# Patient Record
Sex: Male | Born: 1970 | Race: White | Hispanic: No | Marital: Married | Smoking: Never smoker
Health system: Southern US, Community
[De-identification: ages and names within clinical notes are randomized; demographics above are authoritative.]

## PROBLEM LIST (undated history)

## (undated) DIAGNOSIS — K219 Gastro-esophageal reflux disease without esophagitis: Secondary | ICD-10-CM

## (undated) HISTORY — PX: ANKLE SURGERY: SHX546

---

## 2016-02-08 ENCOUNTER — Other Ambulatory Visit: Payer: Self-pay | Admitting: Family Medicine

## 2016-02-08 ENCOUNTER — Encounter: Payer: Self-pay | Admitting: Family Medicine

## 2016-02-08 ENCOUNTER — Ambulatory Visit (INDEPENDENT_AMBULATORY_CARE_PROVIDER_SITE_OTHER): Payer: Managed Care, Other (non HMO) | Admitting: Family Medicine

## 2016-02-08 VITALS — BP 123/69 | HR 52 | Temp 98.4°F | Resp 16 | Ht 74.0 in | Wt 214.0 lb

## 2016-02-08 DIAGNOSIS — Z7689 Persons encountering health services in other specified circumstances: Secondary | ICD-10-CM | POA: Diagnosis not present

## 2016-02-08 DIAGNOSIS — R399 Unspecified symptoms and signs involving the genitourinary system: Secondary | ICD-10-CM | POA: Diagnosis not present

## 2016-02-08 DIAGNOSIS — Z13220 Encounter for screening for lipoid disorders: Secondary | ICD-10-CM

## 2016-02-08 DIAGNOSIS — Z114 Encounter for screening for human immunodeficiency virus [HIV]: Secondary | ICD-10-CM

## 2016-02-08 DIAGNOSIS — Z131 Encounter for screening for diabetes mellitus: Secondary | ICD-10-CM

## 2016-02-08 DIAGNOSIS — Z23 Encounter for immunization: Secondary | ICD-10-CM | POA: Diagnosis not present

## 2016-02-08 DIAGNOSIS — N401 Enlarged prostate with lower urinary tract symptoms: Secondary | ICD-10-CM

## 2016-02-08 DIAGNOSIS — Z0000 Encounter for general adult medical examination without abnormal findings: Secondary | ICD-10-CM

## 2016-02-08 DIAGNOSIS — N138 Other obstructive and reflux uropathy: Secondary | ICD-10-CM | POA: Insufficient documentation

## 2016-02-08 NOTE — Assessment & Plan Note (Signed)
Consistent clinically with new suspected diagnosis chronic gradual worsening BPH, with lower urinary tract symptoms (LUTS) without evidence of obstruction. - AUA BPH score 17 (moderate) (pre-treatment) - Never on therapy - No prior PSA - Last DRE reported 2008, normal - No known personal/family history of prostate CA  Plan: 1. Counseling today on likely diagnosis BPH, prognosis, treatment, answered questions. Will defer DRE until next week with Annual Physical during complete exam for estimation of prostate size. Discussed consider PSA test with upcoming annual physical labs, mutual decision to hold on PSA for now give age < 41 and clinically consistent history of BPH, pending DRE and progress of treatment with likely flomax soon, will consider PSA sooner otherwise can start screening at age 48

## 2016-02-08 NOTE — Progress Notes (Signed)
Subjective:    Patient ID: Mike Dean, male    DOB: Sep 22, 1970, 46 y.o.   MRN: [id number]  Mike Dean is a 46 y.o. male presenting on 02/08/2016 for [place] care with new provider. Recently moved to [place] about >1 year ago from [place], interested to get annual physical for insurance purposes at work.   HPI   ESTABLISH CARE - Reports no significant known chronic medical conditions previously treated for. No problems with HTN, HLD, Glucose. He is interested in general health screening for work insurance, would like labs and routine screening tests. Does not recall last time labs checked several years ago. - Previously athlete with runner - Current lifestyle regimen with tries to limit sugar and carb, no fried foods, 4-5 days week high intensity work-out 30-45 min weight and cardio, structured with trainer at gym  History of possible BPH / LUTS - Prior testing with DRE about 2008 was normal prostate, no prior PSA. But has had more increased LUTS since that time. - Describes weak stream, frequent urination, works out in evening and will drink larger amounts of water in afternoon and in evening, concerned that increased fluid intake may be contributing. Regardless anytime of day soon after drinking any fluid he often has to void. Also he endorses small bit of leakage or dribble following void - See AUA BPH scoring below  Health Maintenance: - Has not taken Flu shot in years, would like to get this today, counseled on side effects / benefits - Suspects last Tetanus vaccine about 2006, would like get this today - No family history of prostate cancer - Due for routine HIV screen  ----------------------------------------------------------------------  AUA BPH Symptom Score over past 1 month 1. Sensation of not emptying bladder post void - 1 2. Urinate less than 2 hour after finish last void - 5 3. Start/Stop several times during void - 0 4. Difficult to postpone  urination - 3 5. Weak urinary stream - 4 6. Push or strain urination - 2 7. Nocturia - 2 times  Total Score: 17 (Moderate BPH symptoms)  History reviewed. No pertinent past medical history. Social History   Social History  . Marital status: Married    Spouse name: N/A  . Number of children: N/A  . Years of education: N/A   Occupational History  . [place] [employer], broadcasting/film/video)    Social History Main Topics  . Smoking status: Never Smoker  . Smokeless tobacco: Never Used  . Alcohol use Yes  . Drug use: Unknown  . Sexual activity: Not on file   Other Topics Concern  . Not on file   Social History Narrative  . No narrative on file   Family History  Problem Relation Age of Onset  . Stroke Mother   . Bipolar disorder Sister   . Breast cancer Maternal Grandmother   . Throat cancer Maternal Grandfather     Smoker  . Colon cancer Other 81  . Prostate cancer Neg Hx    No current outpatient prescriptions on file prior to visit.   No current facility-administered medications on file prior to visit.     Review of Systems Per HPI unless specifically indicated above     Objective:    BP 123/69 (BP Location: Right Arm, Patient Position: Sitting, Cuff Size: Normal)   Pulse (!) 52   Temp 98.4 F (36.9 C) (Oral)   Resp 16   Ht 6\' 2"  (1.88 m)   Wt 214 lb (97.1  kg)   BMI 27.48 kg/m   Wt Readings from Last 3 Encounters:  02/08/16 214 lb (97.1 kg)    Physical Exam  Constitutional: He appears well-developed and well-nourished. No distress.  Well-appearing, comfortable, cooperative  HENT:  Head: Normocephalic and atraumatic.  Mouth/Throat: Oropharynx is clear and moist.  Eyes: Conjunctivae are normal.  Cardiovascular: Regular rhythm, normal heart sounds and intact distal pulses.   No murmur heard. Low resting heart rate at baseline  Pulmonary/Chest: Effort normal and breath sounds normal. No respiratory distress. He has no wheezes. He has no rales.  Neurological:  He is alert.  Skin: Skin is warm and dry. He is not diaphoretic.  Psychiatric: His behavior is normal.  Nursing note and vitals reviewed.  No results found for this or any previous visit.    Assessment & Plan:   Problem List Items Addressed This Visit    Lower urinary tract symptoms (LUTS)    Consistent clinically with new suspected diagnosis chronic gradual worsening BPH, with lower urinary tract symptoms (LUTS) without evidence of obstruction. - AUA BPH score 17 (moderate) (pre-treatment) - Never on therapy - No prior PSA - Last DRE reported 2008, normal - No known personal/family history of prostate CA  Plan: 1. Counseling today on likely diagnosis BPH, prognosis, treatment, answered questions. Will defer DRE until next week with Annual Physical during complete exam for estimation of prostate size. Discussed consider PSA test with upcoming annual physical labs, mutual decision to hold on PSA for now give age < 11 and clinically consistent history of BPH, pending DRE and progress of treatment with likely flomax soon, will consider PSA sooner otherwise can start screening at age 37       Other Visit Diagnoses    Encounter to establish care with new doctor    -  Primary   Establish care today, will place future orders for fasting labs CMET, Lipids, A1c routine HIV, follow-up results at annual physical within 1 week, flu/tdap today   Needs flu shot       Relevant Orders   Flu Vaccine QUAD 36+ mos IM (Completed)   Need for Tdap vaccination       Relevant Orders   Tdap vaccine greater than or equal to 7yo IM (Completed)      No orders of the defined types were placed in this encounter.     Follow up plan: Return in about 2 days (around 02/10/2016) for Lab only visit.  [name], [facility] 02/08/2016, 10:04 PM

## 2016-02-08 NOTE — Patient Instructions (Addendum)
Thank you for coming in to clinic today.  1. Keep up the good work with healthy lifestyle  2. I think that you most likely have lower urinary tract symptoms from BPH or benign prostate hypertrophy - Try for about a week to adjust water intake, perhaps pick a time in evening to stop water intake and try to moderate this to see if this helps any of your symptoms - Next visit we can check rectal exam to see size of prostate - Consider starting Flomax (Tamsulosin) 0.4mg  daily at next visit, to help voiding symptoms  TDap and Flu vaccines today, may take ibuprofen or tylenol as needed for these  You will be due for FASTING BLOOD WORK (no food or drink after midnight before, only water or coffee without cream/sugar on the morning of)  - Please go ahead and schedule a "Lab Only" visit in the morning at the clinic for lab draw in 1-2 days  For Lab Results, once available within 2-3 days of blood draw, you can can log in to MyChart online to view your results and a brief explanation. Also, we can discuss results at next follow-up visit.  If you have any other questions or concerns, please feel free to call the clinic or send a message through [place]. You may also schedule an earlier appointment if necessary.  [name], DO [place]

## 2016-02-10 ENCOUNTER — Other Ambulatory Visit: Payer: Self-pay | Admitting: Family Medicine

## 2016-02-10 ENCOUNTER — Other Ambulatory Visit: Payer: Self-pay | Admitting: *Deleted

## 2016-02-10 ENCOUNTER — Other Ambulatory Visit: Payer: Managed Care, Other (non HMO)

## 2016-02-10 DIAGNOSIS — Z114 Encounter for screening for human immunodeficiency virus [HIV]: Secondary | ICD-10-CM

## 2016-02-10 DIAGNOSIS — Z131 Encounter for screening for diabetes mellitus: Secondary | ICD-10-CM

## 2016-02-10 DIAGNOSIS — Z13220 Encounter for screening for lipoid disorders: Secondary | ICD-10-CM

## 2016-02-10 DIAGNOSIS — Z0000 Encounter for general adult medical examination without abnormal findings: Secondary | ICD-10-CM

## 2016-02-11 LAB — COMPLETE METABOLIC PANEL WITH GFR
ALT: 18 U/L (ref 9–46)
AST: 17 U/L (ref 10–40)
Albumin: 4.3 g/dL (ref 3.6–5.1)
Alkaline Phosphatase: 63 U/L (ref 40–115)
BUN: 24 mg/dL (ref 7–25)
CALCIUM: 9.4 mg/dL (ref 8.6–10.3)
CHLORIDE: 102 mmol/L (ref 98–110)
CO2: 25 mmol/L (ref 20–31)
Creat: 1.16 mg/dL (ref 0.60–1.35)
GFR, Est African American: 86 mL/min (ref >=60)
GFR, Est Non African American: 75 mL/min (ref >=60)
Glucose, Bld: 87 mg/dL (ref 65–99)
POTASSIUM: 4.3 mmol/L (ref 3.5–5.3)
Sodium: 139 mmol/L (ref 135–146)
Total Bilirubin: 0.8 mg/dL (ref 0.2–1.2)
Total Protein: 7 g/dL (ref 6.1–8.1)

## 2016-02-11 LAB — LIPID PANEL
CHOL/HDL RATIO: 2.6 ratio (ref <5.0)
CHOLESTEROL: 147 mg/dL (ref <200)
HDL: 57 mg/dL (ref >40)
LDL Cholesterol: 78 mg/dL (ref <100)
TRIGLYCERIDES: 58 mg/dL (ref <150)
VLDL: 12 mg/dL (ref <30)

## 2016-02-11 LAB — HEMOGLOBIN A1C
Hgb A1c MFr Bld: 5.1 % (ref <5.7)
MEAN PLASMA GLUCOSE: 100 mg/dL

## 2016-02-11 LAB — HIV ANTIBODY (ROUTINE TESTING W REFLEX): HIV 1&2 Ab, 4th Generation: NONREACTIVE

## 2016-02-13 ENCOUNTER — Ambulatory Visit (INDEPENDENT_AMBULATORY_CARE_PROVIDER_SITE_OTHER): Payer: Managed Care, Other (non HMO) | Admitting: Family Medicine

## 2016-02-13 ENCOUNTER — Encounter: Payer: Self-pay | Admitting: Family Medicine

## 2016-02-13 VITALS — BP 118/67 | HR 56 | Temp 98.2°F | Resp 16 | Ht 74.0 in | Wt 214.0 lb

## 2016-02-13 DIAGNOSIS — J3089 Other allergic rhinitis: Secondary | ICD-10-CM | POA: Diagnosis not present

## 2016-02-13 DIAGNOSIS — N138 Other obstructive and reflux uropathy: Secondary | ICD-10-CM

## 2016-02-13 DIAGNOSIS — N401 Enlarged prostate with lower urinary tract symptoms: Secondary | ICD-10-CM

## 2016-02-13 DIAGNOSIS — Z113 Encounter for screening for infections with a predominantly sexual mode of transmission: Secondary | ICD-10-CM

## 2016-02-13 DIAGNOSIS — Z0000 Encounter for general adult medical examination without abnormal findings: Secondary | ICD-10-CM

## 2016-02-13 MED ORDER — FEXOFENADINE HCL 180 MG PO TABS: 180.0000 mg | ORAL_TABLET | Freq: Every day | ORAL | 5 refills | Status: DC

## 2016-02-13 MED ORDER — TAMSULOSIN HCL 0.4 MG PO CAPS: 0.4000 mg | ORAL_CAPSULE | Freq: Every day | ORAL | 5 refills | Status: DC

## 2016-02-13 MED ORDER — FLUTICASONE PROPIONATE 50 MCG/ACT NA SUSP: 2.0000 | Freq: Every day | NASAL | 3 refills | Status: DC

## 2016-02-13 NOTE — Assessment & Plan Note (Signed)
Consistent clinically with new diagnosis chronic gradual worsening BPH, with lower urinary tract symptoms (LUTS) without evidence of obstruction. - AUA BPH score 17 (moderate) (pre-treatment) - Never on therapy - No prior PSA - DRE done today, with moderately enlarged smooth symmetrical prostate, compared to reported DRE 2008, normal - No known personal/family history of prostate CA  Plan: 1. Reassurance that given DRE and history most likely BPH, also reassurance with normal A1c screening unlikely other etiology of polyuria / frequency, however will also check UA, and finish STD screening 2. Start Flomax 0.4mg  daily today, counseled on side effects and effectiveness 3. Advised to adjust water intake, reduce down to about 40-50 oz daily, can reduce some caffeine as well to avoid worsening urinary frequency 4. Check future UA dipstick at nurse visit, will return for first void with GC/Chlam testing later this week 5. Follow-up 6 weeks to 3 months on Flomax, if doing very well then will likely continue at this dose for a while, also counseling on may need increase dose 0.8mg  daily, lastly if need additional therapy then we will check PSA for baseline, prior to starting med such as Finasteride to shrink prostate, and if at any point need second opinion or other concern can get Urology consult

## 2016-02-13 NOTE — Progress Notes (Signed)
Subjective:    Patient ID: Mike Dean, male    DOB: 10-Jan-1971, 46 y.o.   MRN: [id number]  Mike Dean is a 46 y.o. male presenting on 02/13/2016 for Annual Exam  HPI   Annual Physical - Recently seen in office to establish care with new provider, see note for details on background medical history. - Had recent labwork that was essentially normal. Screening labs as well. See lab results for full details, no additional questions on labs. Did have mildly elevated creatinine (but no baseline and not flagged abnormal), patient does admit to consuming some protein supplement regularly while going to the gym. - He remains active, previously athlete with runner - Current lifestyle regimen with tries to limit sugar and carb, no fried foods, 4-5 days week high intensity work-out 30-45 min weight and cardio, structured with trainer at gym  Seasonal and Environemental Allergies - Reports more recently has had problem with environmental / seasonal allergies, did not have as many problems in [place], now in [place] has had difficulty in Spring and Fall seasons. Previously he had to take Allegra-D OTC for 4-6 weeks due to significant sinus symptoms with pressure, congestion, generalized fatigue with Hay Fever, did not take anything preventatively, also has tried OTC Flonase in the past. - Prior history of recurrent sinus infections - Interested in preventative allergy medication with Fexofenadine  STD SCREENING - Request STD testing today, last visit 02/08/16 had routine HIV screening result that was Negative. Today patient reports request for complete STD screening, has not had prior screen test done for many years, possibly 20 years ago. He is concerned with possible exposure to genital HSV about 7 months ago, but denies any direct contact with HSV lesions or outbreak - He has been asymptomatic - Denies any known STD history - Denies any genital ulcer, penile discharge, genital rash or vesicles  BPH  with LUTS: - Last visit 02/08/16 discussed likely new dx BPH given his significant LUTS, see AUA BPH score below from last visit. He has never been treated before. Has had prior DRE in 2008 mostly unremarkable. He has tried to adjust his fluid intake since last visit and noticed it does help a little, but still urinating frequently and still has weak stream often - Admits fluid intake currently is about 7-8 bottles (12oz ea) water most days usually when goes to gym especially, also will drink about 3-4 cups coffee most days - Still describes weak stream, frequent urination, nocturia up to 2 times nightly - Interested in starting Flomax medicine as discussed last visit  Health Maintenance: - UTD Flu vaccine, TDap, on 02/08/16 - No family history of prostate cancer  ----------------------------------------------------------------------  02/08/16 AUA BPH Symptom Score over past 1 month (Questionnaire asked on last visit 02/08/16 - Did not repeat score today) 1. Sensation of not emptying bladder post void - 1 2. Urinate less than 2 hour after finish last void - 5 3. Start/Stop several times during void - 0 4. Difficult to postpone urination - 3 5. Weak urinary stream - 4 6. Push or strain urination - 2 7. Nocturia - 2 times  Total Score: 17 (Moderate BPH symptoms)   No past medical history on file. Social History   Social History  . Marital status: Married    Spouse name: N/A  . Number of children: N/A  . Years of education: N/A   Occupational History  . 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 Smoking  status: Never Smoker  . Smokeless tobacco: Never Used  . Alcohol use Yes  . Drug use: Unknown  . Sexual activity: Not on file   Other Topics Concern  . Not on file   Social History Narrative  . No narrative on file   Family History  Problem Relation Age of Onset  . Stroke Mother   . Bipolar disorder Sister   . Breast cancer Maternal Grandmother   .  Throat cancer Maternal Grandfather     Smoker  . Colon cancer Other 26  . Prostate cancer Neg Hx    No current outpatient prescriptions on file prior to visit.   No current facility-administered medications on file prior to visit.     Review of Systems  Constitutional: Negative for activity change, appetite change, chills, diaphoresis, fatigue, fever and unexpected weight change.  HENT: Negative for congestion, hearing loss and sinus pressure.   Eyes: Negative for visual disturbance.  Respiratory: Negative for cough, chest tightness, shortness of breath and wheezing.   Cardiovascular: Negative for chest pain, palpitations and leg swelling.  Gastrointestinal: Negative for abdominal pain, constipation, diarrhea, nausea and vomiting.  Endocrine: Positive for polyuria. Negative for cold intolerance and polydipsia.  Genitourinary: Positive for difficulty urinating (Weak stream, see HPI) and urgency. Negative for decreased urine volume, discharge, dysuria, flank pain, frequency, genital sores, hematuria, penile pain, penile swelling, scrotal swelling and testicular pain.  Musculoskeletal: Positive for back pain (Mild bilateral low back muscle soreness from work-out). Negative for arthralgias, joint swelling, myalgias and neck pain.  Skin: Negative for rash.  Allergic/Immunologic: Positive for environmental allergies (Spring / Fall).  Neurological: Negative for dizziness, weakness, light-headedness, numbness and headaches.  Hematological: Negative for adenopathy.  Psychiatric/Behavioral: Negative for behavioral problems, dysphoric mood and sleep disturbance. The patient is not nervous/anxious.    Per HPI unless specifically indicated above     Objective:    BP 118/67   Pulse (!) 56   Temp 98.2 F (36.8 C) (Oral)   Resp 16   Ht 6\' 2"  (1.88 m)   Wt 214 lb (97.1 kg)   BMI 27.48 kg/m   Wt Readings from Last 3 Encounters:  02/13/16 214 lb (97.1 kg)  02/08/16 214 lb (97.1 kg)      Physical Exam  Constitutional: He is oriented to person, place, and time. He appears well-developed and well-nourished. No distress.  Well-appearing, comfortable, cooperative  HENT:  Head: Normocephalic and atraumatic.  Mouth/Throat: Oropharynx is clear and moist.  Frontal / maxillary sinuses non-tender. Nares patent without purulence or edema. Bilateral TMs clear without erythema, effusion or bulging. Oropharynx clear without erythema, exudates, edema or asymmetry.  Eyes: Conjunctivae and EOM are normal. Pupils are equal, round, and reactive to light. Right eye exhibits no discharge. Left eye exhibits no discharge.  Neck: Normal range of motion. Neck supple. No thyromegaly present.  Cardiovascular: Regular rhythm, normal heart sounds and intact distal pulses.   No murmur heard. Low resting heart rate at baseline  Pulmonary/Chest: Effort normal and breath sounds normal. No respiratory distress. He has no wheezes. He has no rales.  Abdominal: Soft. He exhibits no distension. There is no tenderness.  Genitourinary:  Genitourinary Comments: Rectal/DRE: Normal external exam without hemorrhoids fissures or abnormality. DRE with palpation of moderately enlarged prostate smooth symmetrical without nodule or tenderness.  Declined external genitalia exam today without any concerns.  Musculoskeletal: Normal range of motion. He exhibits no edema.  Back normal without deformity or abnormal curvature. - Bilateral lower lumbar  paraspinal muscle mild hypertonicity with minimal soreness to palpation. Non tender over spinous process. Full active ROM.  Upper / Lower Extremities: - Normal muscle tone, strength bilateral upper extremities 5/5, lower extremities 5/5 - Bilateral shoulders, knees, wrist, ankles without deformity, tenderness, effusion - Normal Gait  Lymphadenopathy:    He has no cervical adenopathy.  Neurological: He is alert and oriented to person, place, and time. Coordination normal.   Skin: Skin is warm and dry. No rash noted. He is not diaphoretic. No erythema.  Psychiatric: He has a normal mood and affect. His behavior is normal.  Nursing note and vitals reviewed.  I have personally reviewed the following lab results from 02/10/16.  Results for orders placed or performed in visit on 02/10/16  RPR  Result Value Ref Range   RPR Ser Ql  NON REAC  HSV 2 antibody, IgG  Result Value Ref Range   HSV 2 Glycoprotein G Ab, IgG  <0.90 Index      Assessment & Plan:   Problem List Items Addressed This Visit    Environmental and seasonal allergies    Consistent with seasonal allergies Spring/Fall likely rhinosinusitis, worse living in [place] - Sent rx Fexofenadine 180mg  daily for preventative use, start mid Feb through season - Only use Fexofenadine-D if acute problem with sinuses, cautioned against chronic use - Sent rx Flonase use 4-6 weeks seasonally as needed      Relevant Medications   fluticasone (FLONASE) 50 MCG/ACT nasal spray   fexofenadine (ALLEGRA) 180 MG tablet   BPH with obstruction/lower urinary tract symptoms    Consistent clinically with new diagnosis chronic gradual worsening BPH, with lower urinary tract symptoms (LUTS) without evidence of obstruction. - AUA BPH score 17 (moderate) (pre-treatment) - Never on therapy - No prior PSA - DRE done today, with moderately enlarged smooth symmetrical prostate, compared to reported DRE 2008, normal - No known personal/family history of prostate CA  Plan: 1. Reassurance that given DRE and history most likely BPH, also reassurance with normal A1c screening unlikely other etiology of polyuria / frequency, however will also check UA, and finish STD screening 2. Start Flomax 0.4mg  daily today, counseled on side effects and effectiveness 3. Advised to adjust water intake, reduce down to about 40-50 oz daily, can reduce some caffeine as well to avoid worsening urinary frequency 4. Check future UA dipstick at nurse visit,  will return for first void with GC/Chlam testing later this week 5. Follow-up 6 weeks to 3 months on Flomax, if doing very well then will likely continue at this dose for a while, also counseling on may need increase dose 0.8mg  daily, lastly if need additional therapy then we will check PSA for baseline, prior to starting med such as Finasteride to shrink prostate, and if at any point need second opinion or other concern can get Urology consult      Relevant Medications   tamsulosin (FLOMAX) 0.4 MG CAPS capsule   Other Relevant Orders   POCT urinalysis dipstick    Other Visit Diagnoses    Annual physical exam    -  Primary   UTD on health maintenance, labs yearly with annual physical, trend Cr to determine if mildly elevated at baseline, also very low ASCVD risk 1.2%   Screening for STDs (sexually transmitted diseases)       By request, check RPR, HSV2 serum today, and future urine probe GC/Chlam return for first urine spec this week, 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    Relevant Orders   RPR   HSV 2 antibody, IgG   GC/Chlamydia Probe Amp     Meds ordered this encounter  Medications  . fluticasone (FLONASE) 50 MCG/ACT nasal spray    Sig: Place 2 sprays into both nostrils daily. Use for 4-6 weeks then stop and use seasonally or as needed.    Dispense:  16 g    Refill:  3  . fexofenadine (ALLEGRA) 180 MG tablet    Sig: Take 1 tablet (180 mg total) by mouth daily.    Dispense:  30 tablet    Refill:  5  . tamsulosin (FLOMAX) 0.4 MG CAPS capsule    Sig: Take 1 capsule (0.4 mg total) by mouth daily.    Dispense:  30 capsule    Refill:  5      Follow up plan: Return in about 6 weeks (around 03/26/2016) for BPH.  [name], [facility] 02/13/2016, 2:24 PM

## 2016-02-13 NOTE — Patient Instructions (Signed)
Thank you for coming in to clinic today.  1. Most likely have BPH, with lower urinary tract symptoms - Your prostate does feel enlarged on exam today, this is mild to moderate, and no concerning features of a prostate cancer - Start Flomax 0.4mg  daily in morning, everyday to help relax smooth muscles and allow you to void better, this should help the weak stream and may eventually reduce nocturia overnight - Try to slightly reduce amount of fluid intake, aim for about 45 to 50 oz water in 24 hours, this may reduce urinary frequency as well  2. Today other blood tests follow-up STD screening, will discuss results  3. For seasonal allergies - Fexofenadine 180mg  daily for anti-histamine start mid February continue through spring - Also can start Flonase 2 sprays in each nostril daily for about 4-6 weeks or so in early March to help prevent problem  As well  Please schedule NURSE only visit for Urine Test, 1st void of day  Please schedule a follow-up appointment with Dr. Parks Ranger in 6 weeks to 3 month follow-up for BPH  If you have any other questions or concerns, please feel free to call the clinic or send a message through [place]. You may also schedule an earlier appointment if necessary.  [name], DO [place]

## 2016-02-13 NOTE — Assessment & Plan Note (Signed)
Consistent with seasonal allergies Spring/Fall likely rhinosinusitis, worse living in [place] - Sent rx Fexofenadine 180mg  daily for preventative use, start mid Feb through season - Only use Fexofenadine-D if acute problem with sinuses, cautioned against chronic use - Sent rx Flonase use 4-6 weeks seasonally as needed

## 2016-02-14 ENCOUNTER — Encounter: Payer: Self-pay | Admitting: Family Medicine

## 2016-02-14 LAB — HSV 2 ANTIBODY, IGG: HSV 2 Glycoprotein G Ab, IgG: <0.9 Index (ref <0.90)

## 2016-02-14 LAB — RPR

## 2016-02-23 ENCOUNTER — Other Ambulatory Visit: Payer: Self-pay

## 2016-02-23 DIAGNOSIS — N138 Other obstructive and reflux uropathy: Secondary | ICD-10-CM

## 2016-02-23 DIAGNOSIS — N401 Enlarged prostate with lower urinary tract symptoms: Principal | ICD-10-CM

## 2016-02-23 LAB — POCT URINALYSIS DIPSTICK
Bilirubin, UA: NEGATIVE
Blood, UA: NEGATIVE
Glucose, UA: NEGATIVE
Ketones, UA: NEGATIVE
Leukocytes, UA: NEGATIVE
Nitrite, UA: NEGATIVE
Protein, UA: NEGATIVE
Spec Grav, UA: 1.015
Urobilinogen, UA: NEGATIVE
pH, UA: 5

## 2016-02-24 LAB — GC/CHLAMYDIA PROBE AMP
CT PROBE, AMP APTIMA: NOT DETECTED
GC Probe RNA: NOT DETECTED

## 2016-03-26 ENCOUNTER — Ambulatory Visit: Payer: Managed Care, Other (non HMO) | Admitting: Family Medicine

## 2016-04-09 ENCOUNTER — Encounter: Payer: Self-pay | Admitting: Family Medicine

## 2016-04-09 ENCOUNTER — Ambulatory Visit (INDEPENDENT_AMBULATORY_CARE_PROVIDER_SITE_OTHER): Payer: Managed Care, Other (non HMO) | Admitting: Family Medicine

## 2016-04-09 VITALS — BP 118/72 | HR 71 | Temp 98.2°F | Resp 16 | Ht 74.0 in | Wt 216.7 lb

## 2016-04-09 DIAGNOSIS — J3089 Other allergic rhinitis: Secondary | ICD-10-CM

## 2016-04-09 DIAGNOSIS — N138 Other obstructive and reflux uropathy: Secondary | ICD-10-CM

## 2016-04-09 DIAGNOSIS — N401 Enlarged prostate with lower urinary tract symptoms: Secondary | ICD-10-CM | POA: Diagnosis not present

## 2016-04-09 DIAGNOSIS — J302 Other seasonal allergic rhinitis: Secondary | ICD-10-CM

## 2016-04-09 MED ORDER — IPRATROPIUM BROMIDE 0.06 % NA SOLN: 2.0000 | Freq: Four times a day (QID) | NASAL | 0 refills | Status: DC

## 2016-04-09 NOTE — Assessment & Plan Note (Addendum)
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

## 2016-04-09 NOTE — Assessment & Plan Note (Signed)
Significantly improved LUTS on Flomax, consistent with BPH with LUTS without obstruction. Additionally prior work-up unremarkable for other etiology of urinary symptoms. - On Flomax AUA BPH score improved to 6 (from 17) - No prior PSA - Last DRE 02/13/16, moderately enlarged smooth symmetrical prostate - No known personal/family history of prostate CA  Plan: 1. Reassurance given clinical improvement - continue current Flomax 0.4mg  daily - discussion on duration of therapy, ideally would continue longer term, however eager to do trial off after 6 months, agree this is reasonable, if needs to resume, no problem 2. Future again advised to adjust water intake, reduce down to about 40-50 oz daily, can reduce some caffeine as well to avoid worsening urinary frequency 3. Future plan for PSA age >50+ now good improvement on Flomax alone 4. Follow-up 6 months for BPH - if needed could consider dose titrate up Flomax vs possible Finasteride, however suspect will do well on low dose Flomax vs trial off

## 2016-04-09 NOTE — Patient Instructions (Signed)
Thank you for coming in to clinic today.  1. Now likely confirmed BPH given improvement on Flomax - Continue Flomax 0.4mg  daily for now - in future about 3 months if you want to try off of this that is fine to see how your symptoms respond - Your refill goes through 07/2016, let me know if you need a refill, we can do 90 day supply if needed, most people do end up needing to take this long term if it is helping - Not due for PSA until age >28 - Also if you want to try to slightly reduce amount of fluid intake, aim for about 45 to 50 oz water in 24 hours  2. For seasonal allergies - Fexofenadine 180mg  daily for anti-histamine start mid February continue through spring - Start Atrovent nasal spray decongestant 2 sprays in each nostril up to 4 times daily for 7 days - HOLD Flonase while on Atrovent, then resume for 4-6 weeks or so in early March to help prevent problem  As well - You can try OTC Decongestant or sinus medicine as well, taken Ibuprofen/Tylenol as needed for achy symptoms - If worsening to fevers, worsening sinus pain, thicker purulent drainage, productive cough or chest congestion, notify office later this week can add antibiotic, or recommend Mucinex-DM  Please schedule a follow-up appointment with Dr. Parks Ranger in 6 months for follow-up BPH  If you have any other questions or concerns, please feel free to call the clinic or send a message through [place]. You may also schedule an earlier appointment if necessary.  [name], DO [place]

## 2016-04-09 NOTE — Progress Notes (Signed)
Subjective:    Patient ID: Mike Dean, male    DOB: 1970-08-01, 46 y.o.   MRN: [id number]  Mike Dean is a 46 y.o. male presenting on 04/09/2016 for Follow-up (obtw pt has sinusitis no fever and some HA onset 3 days)  HPI   Acute Allergic Rhinosinusitis / Seasonal and Environemental Allergies Last seen 01/2016 for physical, and discussion on allergies, he was given rx Fexofenadine 180mg  daily (no longer on decongestant), and given Flonase nasal spray. - Today follows up for BPH, see below, and has additional complaint of URI sinusitis symptoms. Started 2-3 days ago with worsening sinus congestion, raw irritated throat with sinus drainage, feels generalized achy and tired. Recent potential sick contact with international air travel to [place] last week, no known possible sick contacts - Does have prior history of some recurrent sinus infections and prior hay fever, worst Spring/Summer - Continues on Allegra, and recently added back Flonase for past few days (he had been off of this previously) - Denies any fevers, chills, sweats, nausea, vomiting, diarrhea, abdominal pain, headache, ear pain  FOLLOW-UP BPHwith LUTS: Last visits 1/24 and 02/13/16 for same problem with new diagnosis BPH, see prior note for background information. He had symptoms initially of significant LUTS, elevated AUA BPH score, see below. He was started on Flomax 0.4mg  daily - Today returns for follow-up about 2 months later. He is doing very well on Flomax 0.4mg  daily, tolerating well without side effects. Reports significant improvement in most LUTS, specifically resolved leakage and dripping following voiding, less nocturia, see AUA BPH score below - No change since last visit to fluid intake, still currently about 7-8 bottles (12oz ea) water most days usually when goes to gym especially, also will drink about 3-4 cups coffee most days - No family history of prostate cancer - Denies any urinary retention, dysuria,  hematuria, incontinence  ---------------------------------------------------------------------  04/09/16 on Flomax AUA BPH Symptom Score over past 1 month 1. Sensation of not emptying bladder post void - 0 2. Urinate less than 2 hour after finish last void - 2 3. Start/Stop several times during void - 0 4. Difficult to postpone urination - 1 5. Weak urinary stream - 2 6. Push or strain urination - 0 7. Nocturia - 1 times  Total Score: 6 Mild (compared to last score 17 moderate BPH symptoms on 02/08/16)    Social History  Substance Use Topics  . Smoking status: Never Smoker  . Smokeless tobacco: Never Used  . Alcohol use Yes    Review of Systems   Per HPI unless specifically indicated above     Objective:    BP 118/72   Pulse 71   Temp 98.2 F (36.8 C) (Oral)   Resp 16   Ht 6\' 2"  (1.88 m)   Wt 216 lb 11.2 oz (98.3 kg)   SpO2 99%   BMI 27.82 kg/m   Wt Readings from Last 3 Encounters:  04/09/16 216 lb 11.2 oz (98.3 kg)  02/13/16 214 lb (97.1 kg)  02/08/16 214 lb (97.1 kg)    Physical Exam  Constitutional: He appears well-developed and well-nourished. No distress.  Well-appearing mildly sick but non toxic, comfortable, cooperative  HENT:  Head: Normocephalic and atraumatic.  Mouth/Throat: Oropharynx is clear and moist.  Mild maxillary sinus tenderness bilaterally, frontal non tender. Nares mostly patent with mild congestion without purulence. Bilateral TMs clear without erythema or bulging, very mild clear effusion L TM. Oropharynx clear without erythema, exudates, edema or asymmetry.  Eyes:  Conjunctivae are normal. Right eye exhibits no discharge. Left eye exhibits no discharge.  Neck: Normal range of motion. Neck supple.  Cardiovascular: Normal rate, regular rhythm, normal heart sounds and intact distal pulses.   No murmur heard. Pulmonary/Chest: Effort normal and breath sounds normal. No respiratory distress. He has no wheezes. He has no rales.  Good air  movement  Lymphadenopathy:    He has no cervical adenopathy.  Neurological: He is alert.  Skin: Skin is warm and dry. He is not diaphoretic.  Nursing note and vitals reviewed.  I have personally reviewed the following lab results from 02/2016.  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 Additionally prior work-up unremarkable for other etiology of urinary symptoms. - On Flomax AUA BPH score improved to 6 (from 17) - No prior PSA - Last DRE 02/13/16, moderately enlarged smooth symmetrical prostate - No known personal/family history of prostate CA  Plan: 1. Reassurance given clinical improvement - continue current Flomax 0.4mg  daily - discussion on duration of therapy, ideally would continue longer term, however eager to do trial off after 6 months, agree this is reasonable, if needs to resume, no problem 2. Future again advised to adjust water intake, reduce down to about 40-50 oz daily, can reduce some caffeine as well to avoid worsening urinary frequency 3. Future plan for PSA age >50+ now good improvement on Flomax alone 4. 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  Plan: 1. Reassurance, likely self-limited - no indication for antibiotics at this time 2. Hold Flonase temporarily (since only just restarted) and Start new rx Atrovent nasal spray decongestant 2 sprays in each nostril up to 4 times daily for 7 days - then resume Flonase for prevention in future, use through Spring/Summer 3. Continue Fexofenadine 180mg  daily 4. May add OTC decongestant temporarily for up to 1 week 5. Return criteria reviewed if significant worsening, can notify office consider antibiotic such as Augmentin if consistent with worsening to bacterial infection     Relevant Medications   ipratropium (ATROVENT) 0.06 % nasal spray      Meds ordered this encounter  Medications  .  ipratropium (ATROVENT) 0.06 % nasal spray    Sig: Place 2 sprays into both nostrils 4 (four) times daily. For up to 5-7 days then stop.    Dispense:  15 mL    Refill:  0      Follow up plan: Return in about 6 months (around 10/10/2016) for BPH.  [name], [facility] 04/09/2016, 6:53 PM

## 2016-04-16 ENCOUNTER — Encounter: Payer: Self-pay | Admitting: Family Medicine

## 2016-04-16 DIAGNOSIS — J0110 Acute frontal sinusitis, unspecified: Secondary | ICD-10-CM

## 2016-04-16 MED ORDER — AMOXICILLIN-POT CLAVULANATE 875-125 MG PO TABS: 1.0000 | ORAL_TABLET | Freq: Two times a day (BID) | ORAL | 0 refills | Status: DC

## 2016-10-08 ENCOUNTER — Other Ambulatory Visit: Payer: Self-pay | Admitting: Family Medicine

## 2016-10-08 ENCOUNTER — Encounter: Payer: Self-pay | Admitting: Family Medicine

## 2016-10-08 ENCOUNTER — Ambulatory Visit (INDEPENDENT_AMBULATORY_CARE_PROVIDER_SITE_OTHER): Payer: Managed Care, Other (non HMO) | Admitting: Family Medicine

## 2016-10-08 VITALS — BP 118/61 | HR 65 | Temp 98.6°F | Resp 16 | Ht 74.0 in | Wt 212.0 lb

## 2016-10-08 DIAGNOSIS — N401 Enlarged prostate with lower urinary tract symptoms: Secondary | ICD-10-CM

## 2016-10-08 DIAGNOSIS — D179 Benign lipomatous neoplasm, unspecified: Secondary | ICD-10-CM | POA: Insufficient documentation

## 2016-10-08 DIAGNOSIS — Z0000 Encounter for general adult medical examination without abnormal findings: Secondary | ICD-10-CM

## 2016-10-08 DIAGNOSIS — N138 Other obstructive and reflux uropathy: Secondary | ICD-10-CM | POA: Diagnosis not present

## 2016-10-08 DIAGNOSIS — R7989 Other specified abnormal findings of blood chemistry: Secondary | ICD-10-CM

## 2016-10-08 DIAGNOSIS — Z23 Encounter for immunization: Secondary | ICD-10-CM

## 2016-10-08 DIAGNOSIS — D1779 Benign lipomatous neoplasm of other sites: Secondary | ICD-10-CM

## 2016-10-08 MED ORDER — SAW PALMETTO (SERENOA REPENS) 160 MG PO CAPS: 160.0000 mg | ORAL_CAPSULE | Freq: Two times a day (BID) | ORAL | Status: AC

## 2016-10-08 NOTE — Assessment & Plan Note (Signed)
Remains significantly improved LUTS, now on Saw Palmetto instead of Flomax,  - On Saw Palmetto AUA BPH score relatively stable 9 from 6 (prior 17) - No prior PSA, age < 65 - Last DRE 02/13/16, moderately enlarged smooth symmetrical prostate - No known personal/family history of prostate CA  Plan: 1. Continue OTC supplement Saw Palmetto 160mg  BID - Remain off Flomax for now, may resume this in future, but should only take 1 med if decides to resume, can DC saw palmetto at that time 2. If worsening, consider to adjust water intake, reduce down to about 40-50 oz daily, can reduce some caffeine as well to avoid worsening urinary frequency 3. Future plan for PSA age >97+ 28. Follow-up 4-5 months for Annual Physical - yearly visits at that point unless worsening

## 2016-10-08 NOTE — Patient Instructions (Addendum)
Thank you for coming to the clinic today.  1.  Continue Saw Palmetto 160mg  twice daily, in future if need may switch back to Flomax to try on own  Flu shot today  DUE for FASTING BLOOD WORK (no food or drink after midnight before the lab appointment, only water or coffee without cream/sugar on the morning of)  SCHEDULE "Lab Only" visit in the morning at the clinic for lab draw in 4-5 months  - Make sure Lab Only appointment is at about 1 week before your next appointment, so that results will be available  For Lab Results, once available within 2-3 days of blood draw, you can can log in to MyChart online to view your results and a brief explanation. Also, we can discuss results at next follow-up visit.   Please schedule a Follow-up Appointment to: Return in about 4 months (around 02/14/2017) for Annual Physical.  If you have any other questions or concerns, please feel free to call the clinic or send a message through [place]. You may also schedule an earlier appointment if necessary.  Additionally, you may be receiving a survey about your experience at our clinic within a few days to 1 week by e-mail or mail. We value your feedback.  [name], DO [place]

## 2016-10-08 NOTE — Progress Notes (Signed)
Subjective:    Patient ID: Mike Dean, male    DOB: [birth year], 46 y.o.   MRN: [id number]  Mike Dean is a 46 y.o. male presenting on 10/08/2016 for Benign Prostatic Hypertrophy (follow up)   HPI   FOLLOW-UP BPH with LUTS: - Last visit with me 04/09/16, for follow-up same problem, treated with continued Flomax 0.4mg , see prior notes for background information. - Interval update with patient self discontinued Flomax several months ago and switched to natural remedy with [facility], and has done very well on this route - Today patient reports that he is pleased with natural result on Saw Palmetto, taking 160mg  daily, and tries to take BID as recommended but often forgets 2nd dose - Feels like urinary symptoms much improved compared to pre-treatment, similar to when taking Flomax, his personal preference to not take rx med - Still significant amount of fluid intake with water up to 8 bottles (12 oz each) most days, often when goes to gym inc water, and drinks 3-4 cups coffee - No family history of prostate cancer - Denies any urinary retention, dysuria, hematuria, incontinence  --------------------------------------------------------------------- 10/08/16 AUA BPH Symptom Score over past 1 month 1. Sensation of not emptying bladder post void - 0 2. Urinate less than 2 hour after finish last void - 4 3. Start/Stop several times during void - 0 4. Difficult to postpone urination - 3 5. Weak urinary stream - 0 6. Push or strain urination - 0 7. Nocturia - 2 times (1-3)  Total Score: 9 Mild - on Saw Palmetto Prior scores: -  6 - on Flomax 0.4mg  (03/2016) - 17 - no medicine (01/2016)  ------------------------- Additional concern - reports still has mobile nodule on R forearm, and has similar spot noticed on R side of chest wall, unsure duration, thinks it has been there a while. Asking to check it out, thinks it may be the same type. Was told Lipoma in past.  Health Maintenance: -  Due for Flu Shot, will receive today  Depression screen [facility] 2/9 10/08/2016 02/08/2016  Decreased Interest 0 0  Down, Depressed, Hopeless 1 0  PHQ - 2 Score 1 0    Social History  Substance Use Topics  . Smoking status: Never Smoker  . Smokeless tobacco: Never Used  . Alcohol use Yes    Review of Systems Per HPI unless specifically indicated above     Objective:    BP 118/61   Pulse 65   Temp 98.6 F (37 C) (Oral)   Resp 16   Ht 6\' 2"  (1.88 m)   Wt 212 lb (96.2 kg)   BMI 27.22 kg/m   Wt Readings from Last 3 Encounters:  10/08/16 212 lb (96.2 kg)  04/09/16 216 lb 11.2 oz (98.3 kg)  02/13/16 214 lb (97.1 kg)    Physical Exam  Constitutional: He is oriented to person, place, and time. He appears well-developed and well-nourished. No distress.  Well-appearing, comfortable, cooperative  HENT:  Head: Normocephalic and atraumatic.  Mouth/Throat: Oropharynx is clear and moist.  Cardiovascular: Normal rate.   Pulmonary/Chest: Effort normal.  Musculoskeletal: He exhibits no edema.  Neurological: He is alert and oriented to person, place, and time.  Skin: Skin is warm and dry. No rash noted. He is not diaphoretic. No erythema.  x2 palpable soft mobile discrete nodules, approx 1 cm, one on R forearm and other on R aspect of chest wall lower lateral ribcage. Non tender  Psychiatric: His behavior is normal.  Nursing  note and vitals reviewed.    Results for orders placed or performed in visit on 02/23/16  POCT urinalysis dipstick  Result Value Ref Range   Color, UA amber    Clarity, UA clear    Glucose, UA negative    Bilirubin, UA negative    Ketones, UA negative    Spec Grav, UA 1.015    Blood, UA negative    pH, UA 5.0    Protein, UA negative    Urobilinogen, UA negative    Nitrite, UA negative    Leukocytes, UA Negative Negative      Assessment & Plan:   Problem List Items Addressed This Visit    BPH with obstruction/lower urinary tract symptoms - Primary     Remains significantly improved LUTS, now on Saw Palmetto instead of Flomax,  - On Saw Palmetto AUA BPH score relatively stable 9 from 6 (prior 17) - No prior PSA, age < 36 - Last DRE 02/13/16, moderately enlarged smooth symmetrical prostate - No known personal/family history of prostate CA  Plan: 1. Continue OTC supplement Saw Palmetto 160mg  BID - Remain off Flomax for now, may resume this in future, but should only take 1 med if decides to resume, can DC saw palmetto at that time 2. If worsening, consider to adjust water intake, reduce down to about 40-50 oz daily, can reduce some caffeine as well to avoid worsening urinary frequency 3. Future plan for PSA age >56+ 16. Follow-up 4-5 months for Annual Physical - yearly visits at that point unless worsening      Relevant Medications   saw palmetto 160 MG capsule   Lipoma    Clinically consistent with multiple lipomas, prominently x 1 R forearm and x 1 recently identified R chest wall / flank - Similar appearance, non tender, no other complications or concerns  Plan: 1. Reassurance, continue monitoring and follow-up sooner if changes clinically or if multiple significant other areas, consider referral to Derm in future if need for biopsy if other concerns       Other Visit Diagnoses    Needs flu shot       Relevant Orders   Flu Vaccine QUAD 36+ mos IM (Completed)      Meds ordered this encounter  Medications  . saw palmetto 160 MG capsule    Sig: Take 1 capsule (160 mg total) by mouth 2 (two) times daily.    Follow up plan: Return in about 4 months (around 02/14/2017) for Annual Physical.  [name], DO [facility] 10/08/2016, 10:54 PM

## 2016-10-08 NOTE — Assessment & Plan Note (Signed)
Clinically consistent with multiple lipomas, prominently x 1 R forearm and x 1 recently identified R chest wall / flank - Similar appearance, non tender, no other complications or concerns  Plan: 1. Reassurance, continue monitoring and follow-up sooner if changes clinically or if multiple significant other areas, consider referral to Derm in future if need for biopsy if other concerns

## 2016-10-10 ENCOUNTER — Ambulatory Visit: Payer: Managed Care, Other (non HMO) | Admitting: Family Medicine

## 2016-12-03 ENCOUNTER — Encounter: Payer: Self-pay | Admitting: Family Medicine

## 2017-02-04 ENCOUNTER — Telehealth: Payer: Self-pay | Admitting: Family Medicine

## 2017-02-04 NOTE — Telephone Encounter (Signed)
Pt has physical scheduled for next week.  He will do labs on Wednesday and asked if it was fasting labs [phone]

## 2017-02-04 NOTE — Telephone Encounter (Signed)
Pt advised.

## 2017-02-06 ENCOUNTER — Other Ambulatory Visit: Payer: Managed Care, Other (non HMO)

## 2017-02-06 DIAGNOSIS — Z0000 Encounter for general adult medical examination without abnormal findings: Secondary | ICD-10-CM

## 2017-02-06 DIAGNOSIS — R7989 Other specified abnormal findings of blood chemistry: Secondary | ICD-10-CM

## 2017-02-07 LAB — COMPLETE METABOLIC PANEL WITH GFR
AG RATIO: 1.5 (calc) (ref 1.0–2.5)
ALT: 33 U/L (ref 9–46)
AST: 27 U/L (ref 10–40)
Albumin: 4.1 g/dL (ref 3.6–5.1)
Alkaline phosphatase (APISO): 63 U/L (ref 40–115)
BILIRUBIN TOTAL: 0.7 mg/dL (ref 0.2–1.2)
BUN: 20 mg/dL (ref 7–25)
CALCIUM: 9.5 mg/dL (ref 8.6–10.3)
CHLORIDE: 104 mmol/L (ref 98–110)
CO2: 28 mmol/L (ref 20–32)
Creat: 0.99 mg/dL (ref 0.60–1.35)
GFR, EST NON AFRICAN AMERICAN: 90 mL/min/{1.73_m2} (ref >=60)
GFR, Est African American: 104 mL/min/{1.73_m2} (ref >=60)
GLUCOSE: 94 mg/dL (ref 65–99)
Globulin: 2.8 g/dL (calc) (ref 1.9–3.7)
POTASSIUM: 4.5 mmol/L (ref 3.5–5.3)
Sodium: 139 mmol/L (ref 135–146)
Total Protein: 6.9 g/dL (ref 6.1–8.1)

## 2017-02-07 LAB — LIPID PANEL
Cholesterol: 175 mg/dL (ref <200)
HDL: 70 mg/dL (ref >40)
LDL Cholesterol (Calc): 89 mg/dL (calc)
NON-HDL CHOLESTEROL (CALC): 105 mg/dL (ref <130)
Total CHOL/HDL Ratio: 2.5 (calc) (ref <5.0)
Triglycerides: 70 mg/dL (ref <150)

## 2017-02-07 LAB — CBC WITH DIFFERENTIAL/PLATELET
BASOS ABS: 29 {cells}/uL (ref 0–200)
Basophils Relative: 0.7 %
EOS ABS: 139 {cells}/uL (ref 15–500)
Eosinophils Relative: 3.3 %
HCT: 42.6 % (ref 38.5–50.0)
Hemoglobin: 14.5 g/dL (ref 13.2–17.1)
Lymphs Abs: 1189 cells/uL (ref 850–3900)
MCH: 32 pg (ref 27.0–33.0)
MCHC: 34 g/dL (ref 32.0–36.0)
MCV: 94 fL (ref 80.0–100.0)
MONOS PCT: 10.5 %
MPV: 9.5 fL (ref 7.5–12.5)
Neutro Abs: 2402 cells/uL (ref 1500–7800)
Neutrophils Relative %: 57.2 %
PLATELETS: 241 10*3/uL (ref 140–400)
RBC: 4.53 10*6/uL (ref 4.20–5.80)
RDW: 11.9 % (ref 11.0–15.0)
TOTAL LYMPHOCYTE: 28.3 %
WBC mixed population: 441 cells/uL (ref 200–950)
WBC: 4.2 10*3/uL (ref 3.8–10.8)

## 2017-02-07 LAB — HEMOGLOBIN A1C
EAG (MMOL/L): 5.4 (calc)
HEMOGLOBIN A1C: 5 %{Hb} (ref <5.7)
Mean Plasma Glucose: 97 (calc)

## 2017-02-13 ENCOUNTER — Encounter: Payer: Self-pay | Admitting: Family Medicine

## 2017-02-13 ENCOUNTER — Ambulatory Visit (INDEPENDENT_AMBULATORY_CARE_PROVIDER_SITE_OTHER): Payer: Managed Care, Other (non HMO) | Admitting: Family Medicine

## 2017-02-13 VITALS — BP 118/66 | HR 66 | Temp 98.4°F | Resp 16 | Ht 74.0 in | Wt 214.0 lb

## 2017-02-13 DIAGNOSIS — G8929 Other chronic pain: Secondary | ICD-10-CM

## 2017-02-13 DIAGNOSIS — M25472 Effusion, left ankle: Secondary | ICD-10-CM | POA: Diagnosis not present

## 2017-02-13 DIAGNOSIS — Z0001 Encounter for general adult medical examination with abnormal findings: Secondary | ICD-10-CM | POA: Diagnosis not present

## 2017-02-13 DIAGNOSIS — M25572 Pain in left ankle and joints of left foot: Secondary | ICD-10-CM | POA: Diagnosis not present

## 2017-02-13 DIAGNOSIS — Z0000 Encounter for general adult medical examination without abnormal findings: Secondary | ICD-10-CM

## 2017-02-13 DIAGNOSIS — N138 Other obstructive and reflux uropathy: Secondary | ICD-10-CM

## 2017-02-13 DIAGNOSIS — N401 Enlarged prostate with lower urinary tract symptoms: Secondary | ICD-10-CM

## 2017-02-13 NOTE — Assessment & Plan Note (Signed)
Concern for chronic >2-3 years overuse injury with Left lateral ankle lat malleolus localized swelling and pain with higher impact No acute trauma or injury, reportedly only one significant possible inversion ankle sprain years ago Prior work-up out of state in [place] with eval, imaging, x-ray, podiatry, he states has not had MRI or [place] and given limited treatment options He has modified his exercise/activity to accommodate but still flares up with overexertion Diff dx: Consider more soft tissue overuse injury such as a tendonitis, possible peroneal given location, seems like other structures achilles, plantar fascia and foot structure is intact. No acute bony tenderness, possible chronic ligament injury of lateral ankle but seems would be more unstable  Plan 1. Discussion on chronicity and recurrence of problem - agree to proceed with 2nd opinion referral to learn more about dx, refer to [facility] - Dr Micheline Chapman for evaluation, likely [facility] [place], possibly MRI L ankle, consider non surgical options first and in future if ultimately needs further intervention / management may refer to Podiatry vs Ortho Foot/Ankle - but patient not interested in surgery if he can avoid it, goal to get back to running

## 2017-02-13 NOTE — Progress Notes (Signed)
Subjective:    Patient ID: Mike Dean, male    DOB: [birth year], 47 y.o.   MRN: [id number]  Mike Dean is a 47 y.o. male presenting on 02/13/2017 for Annual Exam and Other (Left ankle pain)   HPI  Here for Annual Exam and Lab Review.  Lifestyle / BMI >27 He reports overall doing well, stays active with exercise routine, tries to eat healthy Lab results with normal cholesterol, chemistry and A1c screening He has no chronic medical concerns at this time Weight is stable, see trend below He is sleeping well  Chronic Left Ankle - Reports problem for years, he was seen by PCP previously in [place], he was running a lot, thought had repetitive injury, and then referred to Podiatry, had X-rays and testing and no diagnosis for a while. He ultimately was diagnosed with chronic scar tissue in L ankle from old sports injuries, he followed by chiropractor with rehab and treatments. Some improvement. - He complains now of this issue as it still has never resolved. He has modified his exercise program and no longer can do running, jumping or higher impact exercises, he says it affects his fitness goals but not impacting his daily life function or walking etc - Had one significant inversion ankle, but no known fracture or surgery on ankle - Admits chronic swelling - Denies redness, new injury trauma, or other joint related problem  FOLLOW-UP BPH with LUTS: - Last visit with me 09/2016, for follow-up same problem, see prior notes for background information. - Interval update with patient in past has self discontinued Flomax 0.4mg  and started OTC Saw Palmetto 160mg  BID with good results - Today reports he is stable doing well without any urinary symptom changes, see score below - Still significant amount of fluid intake with water up to 8 bottles (12 oz each) most days, often when goes to gym inc water, and drinks 3-4 cups coffee - No family history of prostate  cancer   --------------------------------------------------------------------- Score today 02/13/17 AUA BPH Symptom Score over past 1 month 1. Sensation of not emptying bladder post void - 0 2. Urinate less than 2 hour after finish last void - 4 3. Start/Stop several times during void - 0 4. Difficult to postpone urination - 3 5. Weak urinary stream - 0 6. Push or strain urination - 0 7. Nocturia - 2 times (1-3)  Total Score: 9 Mild  Prior scores: -  6 - on Flomax 0.4mg  (03/2016) - 17 - no medicine (01/2016) - 9 (unchanged) - Saw palmetto (10/08/16)  Additional updates: - Acute stressors / Anxiety - reports this issue has resolved in interval 1-2 months. He reached out to me via mychart on 12/03/16, see message, asking about discussing anxiety/stress, not discussed details of stressor. Today he says all of the stressors have been resolved and he is doing much better, and says that the anxiety / stress has passed.  Health Maintenance: UTD Flu Shot 09/2016 UTD routine HIV screen UTD TDap  Prostate CA Screening: No prior prostate CA screening, except has had DRE last by me 01/2016 with moderately enlarged prostate. No prior PSA lab. Currently stable BPH LUTS (see above). No known family history of prostate CA. Not due for PSA until age 24 as discussed.  Colon CA Screening: Never had colon CA screening. No prior colonoscopy. Currently asymptomatic. No known family history of colon CA. Discussed recommendation for screening age 2 to 66, agree to wait until age 47. Considering Cologuard, counseling given handout  reviewed.   Depression screen [facility] 2/9 02/13/2017 10/08/2016 02/08/2016  Decreased Interest 0 0 0  Down, Depressed, Hopeless 0 1 0  PHQ - 2 Score 0 1 0    History reviewed. No pertinent past medical history. History reviewed. No pertinent surgical history. Social History   Socioeconomic History  . Marital status: Married    Spouse name: Not on file  . Number of children: Not  on file  . Years of education: Not on file  . Highest education level: Not on file  Social Needs  . Financial resource strain: Not on file  . Food insecurity - worry: Not on file  . Food insecurity - inability: Not on file  . Transportation needs - medical: Not on file  . Transportation needs - non-medical: Not on file  Occupational History  . Occupation: Manufacturing systems engineer)  Tobacco Use  . Smoking status: Never Smoker  . Smokeless tobacco: Never Used  Substance and Sexual Activity  . Alcohol use: Yes  . Drug use: No  . Sexual activity: Not on file  Other Topics Concern  . Not on file  Social History Narrative  . Not on file   Family History  Problem Relation Age of Onset  . Stroke Mother   . Bipolar disorder Sister   . Breast cancer Maternal Grandmother   . Throat cancer Maternal Grandfather        Smoker  . Colon cancer Other 64  . Prostate cancer Neg Hx    Current Outpatient Medications on File Prior to Visit  Medication Sig  . fexofenadine (ALLEGRA) 180 MG tablet Take 1 tablet (180 mg total) by mouth daily.  . fluticasone (FLONASE) 50 MCG/ACT nasal spray Place 2 sprays into both nostrils daily. Use for 4-6 weeks then stop and use seasonally or as needed.  . saw palmetto 160 MG capsule Take 1 capsule (160 mg total) by mouth 2 (two) times daily.   No current facility-administered medications on file prior to visit.     Review of Systems  Constitutional: Negative for activity change, appetite change, chills, diaphoresis, fatigue and fever.  HENT: Positive for postnasal drip. Negative for congestion and hearing loss.   Eyes: Negative for visual disturbance.  Respiratory: Negative for apnea, cough, choking, chest tightness, shortness of breath and wheezing.   Cardiovascular: Negative for chest pain, palpitations and leg swelling.  Gastrointestinal: Negative for abdominal pain, anal bleeding, blood in stool, constipation, diarrhea, nausea and vomiting.   Endocrine: Negative for cold intolerance and polyuria.  Genitourinary: Negative for decreased urine volume, difficulty urinating, dysuria, frequency, hematuria, testicular pain and urgency.       Nocturia  Musculoskeletal: Positive for arthralgias (Left ankle, chronic pain and swelling) and joint swelling (L lateral ankle). Negative for neck pain.  Skin: Negative for rash.  Allergic/Immunologic: Positive for environmental allergies.  Neurological: Negative for dizziness, weakness, light-headedness, numbness and headaches.  Hematological: Negative for adenopathy.  Psychiatric/Behavioral: Negative for behavioral problems, dysphoric mood and sleep disturbance. The patient is not nervous/anxious (Improved now).    Per HPI unless specifically indicated above    Objective:    BP 118/66   Pulse 66   Temp 98.4 F (36.9 C) (Oral)   Resp 16   Ht 6\' 2"  (1.88 m)   Wt 214 lb (97.1 kg)   BMI 27.48 kg/m   Wt Readings from Last 3 Encounters:  02/13/17 214 lb (97.1 kg)  10/08/16 212 lb (96.2 kg)  04/09/16 216 lb 11.2 oz (  98.3 kg)    Physical Exam  Constitutional: He is oriented to person, place, and time. He appears well-developed and well-nourished. No distress.  Well-appearing, comfortable, cooperative, athletic build  HENT:  Head: Normocephalic and atraumatic.  Mouth/Throat: Oropharynx is clear and moist.  Frontal / maxillary sinuses non-tender. Nares patent without purulence or edema. Bilateral TMs clear without erythema, effusion or bulging. Oropharynx clear without erythema, exudates, edema or asymmetry.  Eyes: Conjunctivae and EOM are normal. Pupils are equal, round, and reactive to light. Right eye exhibits no discharge. Left eye exhibits no discharge.  Neck: Normal range of motion. Neck supple. No thyromegaly present.  Cardiovascular: Normal rate, regular rhythm, normal heart sounds and intact distal pulses.  No murmur heard. Pulmonary/Chest: Effort normal and breath sounds normal.  No respiratory distress. He has no wheezes. He has no rales.  Abdominal: Soft. Bowel sounds are normal. He exhibits no distension and no mass. There is no tenderness.  Musculoskeletal: Normal range of motion. He exhibits no edema or tenderness.  Upper / Lower Extremities: - Normal muscle tone, strength bilateral upper extremities 5/5, lower extremities 5/5  Left Ankle Inspection: See picture, localized soft tissue edema surrounding lateral malleolus, no erythema or ecchymosis, asymmetrical from R ankle and other aspects of L ankle Palpation: Mild discomfort over lateral malleolus ATF region, not really significant for bony tenderness. L Achilles non tender, plantar fascia, forefoot and mid foot all non tender. ROM: full active ROM Special Testing: Some laxity with ankle inversion cautious extreme range of motion, otherwise seems structurally intact. Strength: intact Neurovascular: intact  Lymphadenopathy:    He has no cervical adenopathy.  Neurological: He is alert and oriented to person, place, and time.  Distal sensation intact to light touch all extremities  Skin: Skin is warm and dry. No rash noted. He is not diaphoretic. No erythema.  Psychiatric: He has a normal mood and affect. His behavior is normal.  Well groomed, good eye contact, normal speech and thoughts  Nursing note and vitals reviewed.       Left Ankle Lateral     Results for orders placed or performed in visit on 02/06/17  Hemoglobin A1c  Result Value Ref Range   Hgb A1c MFr Bld 5.0 <5.7 % of total Hgb   Mean Plasma Glucose 97 (calc)   eAG (mmol/L) 5.4 (calc)  Lipid panel  Result Value Ref Range   Cholesterol 175 <200 mg/dL   HDL 70 >40 mg/dL   Triglycerides 70 <150 mg/dL   LDL Cholesterol (Calc) 89 mg/dL (calc)   Total CHOL/HDL Ratio 2.5 <5.0 (calc)   Non-HDL Cholesterol (Calc) 105 <130 mg/dL (calc)  CBC with Differential/Platelet  Result Value Ref Range   WBC 4.2 3.8 - 10.8 Thousand/uL   RBC 4.53  4.20 - 5.80 Million/uL   Hemoglobin 14.5 13.2 - 17.1 g/dL   HCT 42.6 38.5 - 50.0 %   MCV 94.0 80.0 - 100.0 fL   MCH 32.0 27.0 - 33.0 pg   MCHC 34.0 32.0 - 36.0 g/dL   RDW 11.9 11.0 - 15.0 %   Platelets 241 140 - 400 Thousand/uL   MPV 9.5 7.5 - 12.5 fL   Neutro Abs 2,402 1,500 - 7,800 cells/uL   Lymphs Abs 1,189 850 - 3,900 cells/uL   WBC mixed population 441 200 - 950 cells/uL   Eosinophils Absolute 139 15 - 500 cells/uL   Basophils Absolute 29 0 - 200 cells/uL   Neutrophils Relative % 57.2 %   Total Lymphocyte 28.3 %  Monocytes Relative 10.5 %   Eosinophils Relative 3.3 %   Basophils Relative 0.7 %  COMPLETE METABOLIC PANEL WITH GFR  Result Value Ref Range   Glucose, Bld 94 65 - 99 mg/dL   BUN 20 7 - 25 mg/dL   Creat 0.99 0.60 - 1.35 mg/dL   GFR, Est Non African American 90 > OR = 60 mL/min/1.49m2   GFR, Est African American 104 > OR = 60 mL/min/1.25m2   BUN/Creatinine Ratio NOT APPLICABLE 6 - 22 (calc)   Sodium 139 135 - 146 mmol/L   Potassium 4.5 3.5 - 5.3 mmol/L   Chloride 104 98 - 110 mmol/L   CO2 28 20 - 32 mmol/L   Calcium 9.5 8.6 - 10.3 mg/dL   Total Protein 6.9 6.1 - 8.1 g/dL   Albumin 4.1 3.6 - 5.1 g/dL   Globulin 2.8 1.9 - 3.7 g/dL (calc)   AG Ratio 1.5 1.0 - 2.5 (calc)   Total Bilirubin 0.7 0.2 - 1.2 mg/dL   Alkaline phosphatase (APISO) 63 40 - 115 U/L   AST 27 10 - 40 U/L   ALT 33 9 - 46 U/L      Assessment & Plan:   Problem List Items Addressed This Visit    BPH with obstruction/lower urinary tract symptoms    Stable still significantly improved LUTS - On Saw Palmetto AUA BPH score still stable 9 (unchanged from 9), previously worse off meds - Off Flomax (decision to not take) - No prior PSA, age < 68 - Last DRE 02/13/16, moderately enlarged smooth symmetrical prostate - No known personal/family history of prostate CA  Plan: 1. Continue OTC supplement Saw Palmetto 160mg  BID 2. If worsening, consider to adjust water intake, reduce down to about  40-50 oz daily, can reduce some caffeine as well to avoid worsening urinary frequency 3. Future plan for PSA age >47+ - discussed again today - agrees to wait until 83 for PSA      Chronic pain of left ankle    Concern for chronic >2-3 years overuse injury with Left lateral ankle lat malleolus localized swelling and pain with higher impact No acute trauma or injury, reportedly only one significant possible inversion ankle sprain years ago Prior work-up out of state in [place] with eval, imaging, x-ray, podiatry, he states has not had MRI or [place] and given limited treatment options He has modified his exercise/activity to accommodate but still flares up with overexertion Diff dx: Consider more soft tissue overuse injury such as a tendonitis, possible peroneal given location, seems like other structures achilles, plantar fascia and foot structure is intact. No acute bony tenderness, possible chronic ligament injury of lateral ankle but seems would be more unstable  Plan 1. Discussion on chronicity and recurrence of problem - agree to proceed with 2nd opinion referral to learn more about dx, refer to [facility] - Dr Micheline Chapman for evaluation, likely [facility] [place], possibly MRI L ankle, consider non surgical options first and in future if ultimately needs further intervention / management may refer to Podiatry vs Ortho Foot/Ankle - but patient not interested in surgery if he can avoid it, goal to get back to running      Relevant Orders   Ambulatory referral to Sports Medicine    Other Visit Diagnoses    Annual physical exam    -  Primary UTD Health Maintenance, see above Encourge healthy lifestyle, diet exercise    Left ankle swelling  Relevant Orders   Ambulatory referral to Sports Medicine      No orders of the defined types were placed in this encounter.  Orders Placed This Encounter  Procedures  . Ambulatory referral to Sports Medicine    Referral Priority:   Routine     Referral Type:   Consultation    Referred to Provider:   Thurman Coyer, DO    Number of Visits Requested:   1     Follow up plan: Return in about 1 year (around 02/13/2018) for Annual Physical.  [name], DO [facility] 02/13/2017, 1:24 PM

## 2017-02-13 NOTE — Assessment & Plan Note (Signed)
Stable still significantly improved LUTS - On Saw Palmetto AUA BPH score still stable 9 (unchanged from 9), previously worse off meds - Off Flomax (decision to not take) - No prior PSA, age < 70 - Last DRE 02/13/16, moderately enlarged smooth symmetrical prostate - No known personal/family history of prostate CA  Plan: 1. Continue OTC supplement Saw Palmetto 160mg  BID 2. If worsening, consider to adjust water intake, reduce down to about 40-50 oz daily, can reduce some caffeine as well to avoid worsening urinary frequency 3. Future plan for PSA age >35+ - discussed again today - agrees to wait until 51 for PSA

## 2017-02-13 NOTE — Patient Instructions (Addendum)
Thank you for coming to the office today.  1.  Blood work is completely normal  Keep up the great work overall  Continue [employer], we will check PSA for prostate at age 47  In future we will consider Cologuard  --------------------  For Left Ankle swelling and pain, I think most likely an overuse syndrome after old injuries, may have had old structural injury to ankle, and I agree possible scar tissue. You could now have a resulting tendonitis with recurrent swelling  Referral sent to   [facility] Address: [address], [place], [place] [address] Phone: [phone]  Lilia Argue, DO Andreas Blower, MD [place]   Colon Cancer Screening: - For all adults age 73+ routine colon cancer screening is highly recommended.     - Recent guidelines from [place] recommend starting age of 70 - Early detection of colon cancer is important, because often there are no warning signs or symptoms, also if found early usually it can be cured. Late stage is hard to treat.  - If you are not interested in Colonoscopy screening (if done and normal you could be cleared for 5 to 10 years until next due), then Cologuard is an excellent alternative for screening test for Colon Cancer. It is highly sensitive for detecting DNA of colon cancer from even the earliest stages. Also, there is NO bowel prep required. - If Cologuard is NEGATIVE, then it is good for 3 years before next due - If Cologuard is POSITIVE, then it is strongly advised to get a Colonoscopy, which allows the GI doctor to locate the source of the cancer or polyp (even very early stage) and treat it by removing it. ------------------------- If you would like to proceed with Cologuard (stool DNA test) - FIRST, call your insurance company and tell them you want to check cost of Cologuard tell them CPT Code [phone] (it may be completely covered and you could get for no cost, OR max cost without any coverage  is about $600). Also, keep in mind if you do NOT open the kit, and decide not to do the test, you will NOT be charged, you should contact the company if you decide not to do the test. - If you want to proceed, you can notify us (phone message, [place], or at next visit) and we will order it for you. The test kit will be delivered to you house within about 1 week. Follow instructions to collect sample, you may call the company for any help or questions, 24/7 telephone support at [phone].   DUE for FASTING BLOOD WORK (no food or drink after midnight before the lab appointment, only water or coffee without cream/sugar on the morning of)  SCHEDULE "Lab Only" visit in the morning at the clinic for lab draw in 1 YEAR  - Make sure Lab Only appointment is at about 1 week before your next appointment, so that results will be available  For Lab Results, once available within 2-3 days of blood draw, you can can log in to MyChart online to view your results and a brief explanation. Also, we can discuss results at next follow-up visit.   Please schedule a Follow-up Appointment to: Return in about 1 year (around 02/13/2018) for Annual Physical.    If you have any other questions or concerns, please feel free to call the office or send a message through [place]. You may also schedule an earlier appointment if necessary.  Additionally,  you may be receiving a survey about your experience at our office within a few days to 1 week by e-mail or mail. We value your feedback.  Mike Putnam, DO [place]

## 2017-02-18 ENCOUNTER — Ambulatory Visit
Admission: RE | Admit: 2017-02-18 | Discharge: 2017-02-18 | Disposition: A | Discharge status: Home / Self Care | Payer: Managed Care, Other (non HMO) | Source: Ambulatory Visit | Attending: Sports Medicine | Admitting: Sports Medicine

## 2017-02-18 ENCOUNTER — Encounter: Payer: Self-pay | Admitting: Sports Medicine

## 2017-02-18 ENCOUNTER — Ambulatory Visit (INDEPENDENT_AMBULATORY_CARE_PROVIDER_SITE_OTHER): Payer: Managed Care, Other (non HMO) | Admitting: Sports Medicine

## 2017-02-18 VITALS — BP 118/70 | Ht 74.0 in | Wt 210.0 lb

## 2017-02-18 DIAGNOSIS — G8929 Other chronic pain: Secondary | ICD-10-CM

## 2017-02-18 DIAGNOSIS — M25572 Pain in left ankle and joints of left foot: Secondary | ICD-10-CM

## 2017-02-18 NOTE — Progress Notes (Addendum)
   Subjective:    Patient ID: Mike Dean, male    DOB: [birth year], 47 y.o.   MRN: [id number]  HPI  Patient presents with chronic L lateral ankle pain.   History of multiple L ankle sprains in high school, however current pain began around 2013. Was living in [place] at that time. He was first evaluated by a podiatrist there, who obtained plain films and suggested that pain may be due to scar tissue. Podiatrist sent him to a chiropractor who taped his ankle, which was not effective. He subsequently moved to [facility], and started seeing a massage therapist for treatment. This was not effective either. He was finally referred to sports med by his current PCP.  Patient was formerly a runner, but is now unable to run due to pain. Is still able to do HIIT, but pain is beginning to limit this as well. Does not have pain during HIIT sessions, but has significant pain, sometimes with limping, afterwards, especially if he does multiple days of HIIT in a row.  Pain only located on L lateral ankle. Has persistent swelling on the L, but no erythema or bruising. Non-tender to palpation, but does report a "crunchy" feeling at times when massaging the area himself. Rest improves the pain. Does not take any OTC meds regularly for pain, so cannot say whether this is effective or not.   Review of Systems  Musculoskeletal:       L ankle pain      Objective:   Physical Exam  Constitutional: He is oriented to person, place, and time. He appears well-developed and well-nourished. No distress.  HENT:  Head: Normocephalic and atraumatic.  Pulmonary/Chest: Effort normal. No respiratory distress.  Musculoskeletal:  L lateral malleolar effusion, non-tender to palpation, no crepitus. 2+ talar tilt and mild laxity with anterior drawer on L. Full ROM, active and passive on L. Normal longitudinal and transverse arches bilaterally. No gait abnormalities.   Neurological: He is alert and oriented to person, place, and time.    Skin:  No erythema or ecchymosis of L foot or ankle. Skin otherwise warm and dry.   Psychiatric: He has a normal mood and affect. His behavior is normal.      Assessment & Plan:  Chronic pain of left ankle Etiology unclear. Apparent history of chronic ankle instability given multiple ankle injuries as a teenager, which may be contributing to current issues. Also history of long distance running, though no known injuries sustained as a result. Will repeat plain films as last obtained in 2013 to rule out any obvious bony abnormalities, including arthritis. If normal or no obvious cause identified, will proceed with MRI for further assessment.   Adin Hector, MD, MPH PGY-3 [facility] Pager [phone]  Patient seen and evaluated with the resident. I agree with the above plan of care. Patient has a history of multiple inversion injuries to the ankle. He has an obvious effusion today on exam. Her going to get some updated x-rays to rule out osteoarthritis. If unremarkable, proceed with MRI specifically to rule out an osteochondral defect which may benefit from surgical correction. Phone follow-up with those studies when available. We will delineate more definitive treatment based on those findings.  Addendum: X-rays reviewed. There are some mild degenerative changes throughout the ankle but not severe. Proceed with MRI scan to rule out OCD.

## 2017-02-18 NOTE — Progress Notes (Signed)
   Subjective:    Patient ID: Mike Dean, male    DOB: April 05, 1970, 47 y.o.   MRN: [id number]  HPI    Review of Systems     Objective:   Physical Exam        Assessment & Plan:

## 2017-02-18 NOTE — Patient Instructions (Signed)
It was nice meeting you today Mr. Hosang!  We will call you with the results of your xrays and discuss the next steps at that time.   If you have any questions or concerns, please feel free to call the clinic.   Be well,  Dr. Avon Gully

## 2017-02-18 NOTE — Assessment & Plan Note (Signed)
Etiology unclear. Apparent history of chronic ankle instability given multiple ankle injuries as a teenager, which may be contributing to current issues. Also history of long distance running, though no known injuries sustained as a result. Will repeat plain films as last obtained in 2013 to rule out any obvious bony abnormalities, including arthritis. If normal or no obvious cause identified, will proceed with MRI for further assessment.

## 2017-02-19 ENCOUNTER — Encounter: Payer: Self-pay | Admitting: Family Medicine

## 2017-02-19 ENCOUNTER — Other Ambulatory Visit: Payer: Self-pay | Admitting: Family Medicine

## 2017-02-19 DIAGNOSIS — J3089 Other allergic rhinitis: Secondary | ICD-10-CM

## 2017-02-19 MED ORDER — FLUTICASONE PROPIONATE 50 MCG/ACT NA SUSP: 2.0000 | Freq: Every day | NASAL | 3 refills | Status: AC

## 2017-02-19 MED ORDER — FEXOFENADINE HCL 180 MG PO TABS: 180.0000 mg | ORAL_TABLET | Freq: Every day | ORAL | 5 refills | Status: DC

## 2017-02-21 ENCOUNTER — Ambulatory Visit
Admission: RE | Admit: 2017-02-21 | Discharge: 2017-02-21 | Disposition: A | Discharge status: Home / Self Care | Payer: Managed Care, Other (non HMO) | Source: Ambulatory Visit | Attending: Sports Medicine | Admitting: Sports Medicine

## 2017-02-21 DIAGNOSIS — M25572 Pain in left ankle and joints of left foot: Secondary | ICD-10-CM

## 2017-02-22 ENCOUNTER — Telehealth: Payer: Self-pay | Admitting: Sports Medicine

## 2017-02-22 NOTE — Addendum Note (Signed)
Addended by: Jolinda Croak E on: 02/22/2017 10:43 AM   Modules accepted: Orders

## 2017-02-22 NOTE — Telephone Encounter (Signed)
I spoke with the patient on the phone today regarding the MRI of his left ankle. 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 He also has a large osteochondral lesion of the talus with extensive cystic change, cartilage loss, and marrow edema. He denies any recent trauma. Based on these findings, I recommend orthopedic surgical consultation with Dr. Doran Durand at [facility]. I will defer further workup and treatment to the discretion of Dr. Doran Durand and the patient will follow-up with me as needed.

## 2017-03-29 ENCOUNTER — Encounter: Payer: Self-pay | Admitting: Family Medicine

## 2017-03-29 DIAGNOSIS — M12272 Villonodular synovitis (pigmented), left ankle and foot: Secondary | ICD-10-CM | POA: Insufficient documentation

## 2017-03-29 DIAGNOSIS — M958 Other specified acquired deformities of musculoskeletal system: Secondary | ICD-10-CM

## 2017-04-01 ENCOUNTER — Encounter: Payer: Self-pay | Admitting: Family Medicine

## 2017-11-14 DIAGNOSIS — L739 Follicular disorder, unspecified: Secondary | ICD-10-CM

## 2017-11-14 MED ORDER — AMOXICILLIN-POT CLAVULANATE 875-125 MG PO TABS: 1.0000 | ORAL_TABLET | Freq: Two times a day (BID) | ORAL | 0 refills | Status: DC

## 2017-11-14 MED ORDER — MUPIROCIN 2 % EX OINT: 1.0000 "application " | TOPICAL_OINTMENT | Freq: Two times a day (BID) | CUTANEOUS | 0 refills | Status: DC

## 2018-01-17 ENCOUNTER — Other Ambulatory Visit: Payer: Self-pay | Admitting: Family Medicine

## 2018-01-17 DIAGNOSIS — J3089 Other allergic rhinitis: Secondary | ICD-10-CM

## 2018-02-12 ENCOUNTER — Other Ambulatory Visit: Payer: Managed Care, Other (non HMO)

## 2018-02-17 ENCOUNTER — Encounter: Payer: Managed Care, Other (non HMO) | Admitting: Family Medicine

## 2018-10-22 ENCOUNTER — Other Ambulatory Visit: Payer: Managed Care, Other (non HMO)

## 2018-10-30 ENCOUNTER — Encounter: Payer: Managed Care, Other (non HMO) | Admitting: Family Medicine

## 2018-12-25 IMAGING — DX DG ANKLE COMPLETE 3+V*L*
3 series · 3 of 3 positions shown · non-contrast
Comparison: None in PACs

CLINICAL DATA: Lateral left ankle pain. History of previous injury
several years ago.

EXAM:
LEFT ANKLE COMPLETE - 3+ VIEW

[dg ankle complete left (1 of 3)]
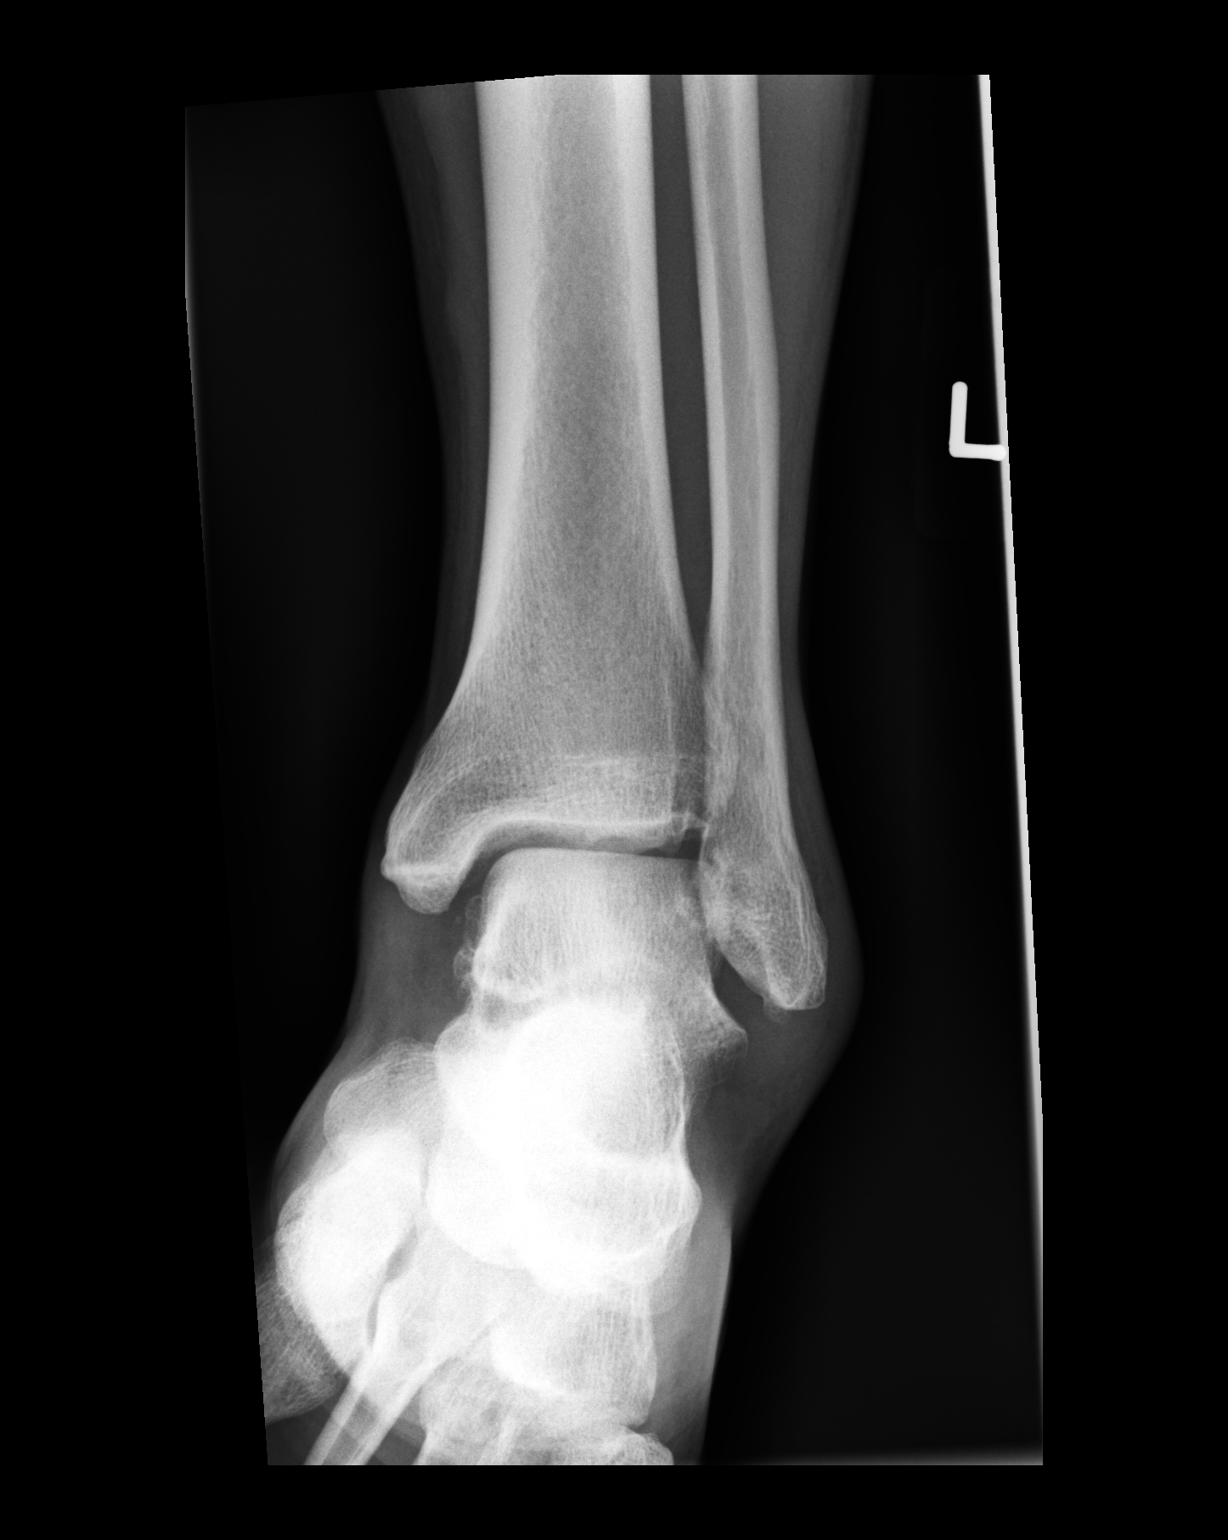

[dg ankle complete left (2 of 3)]
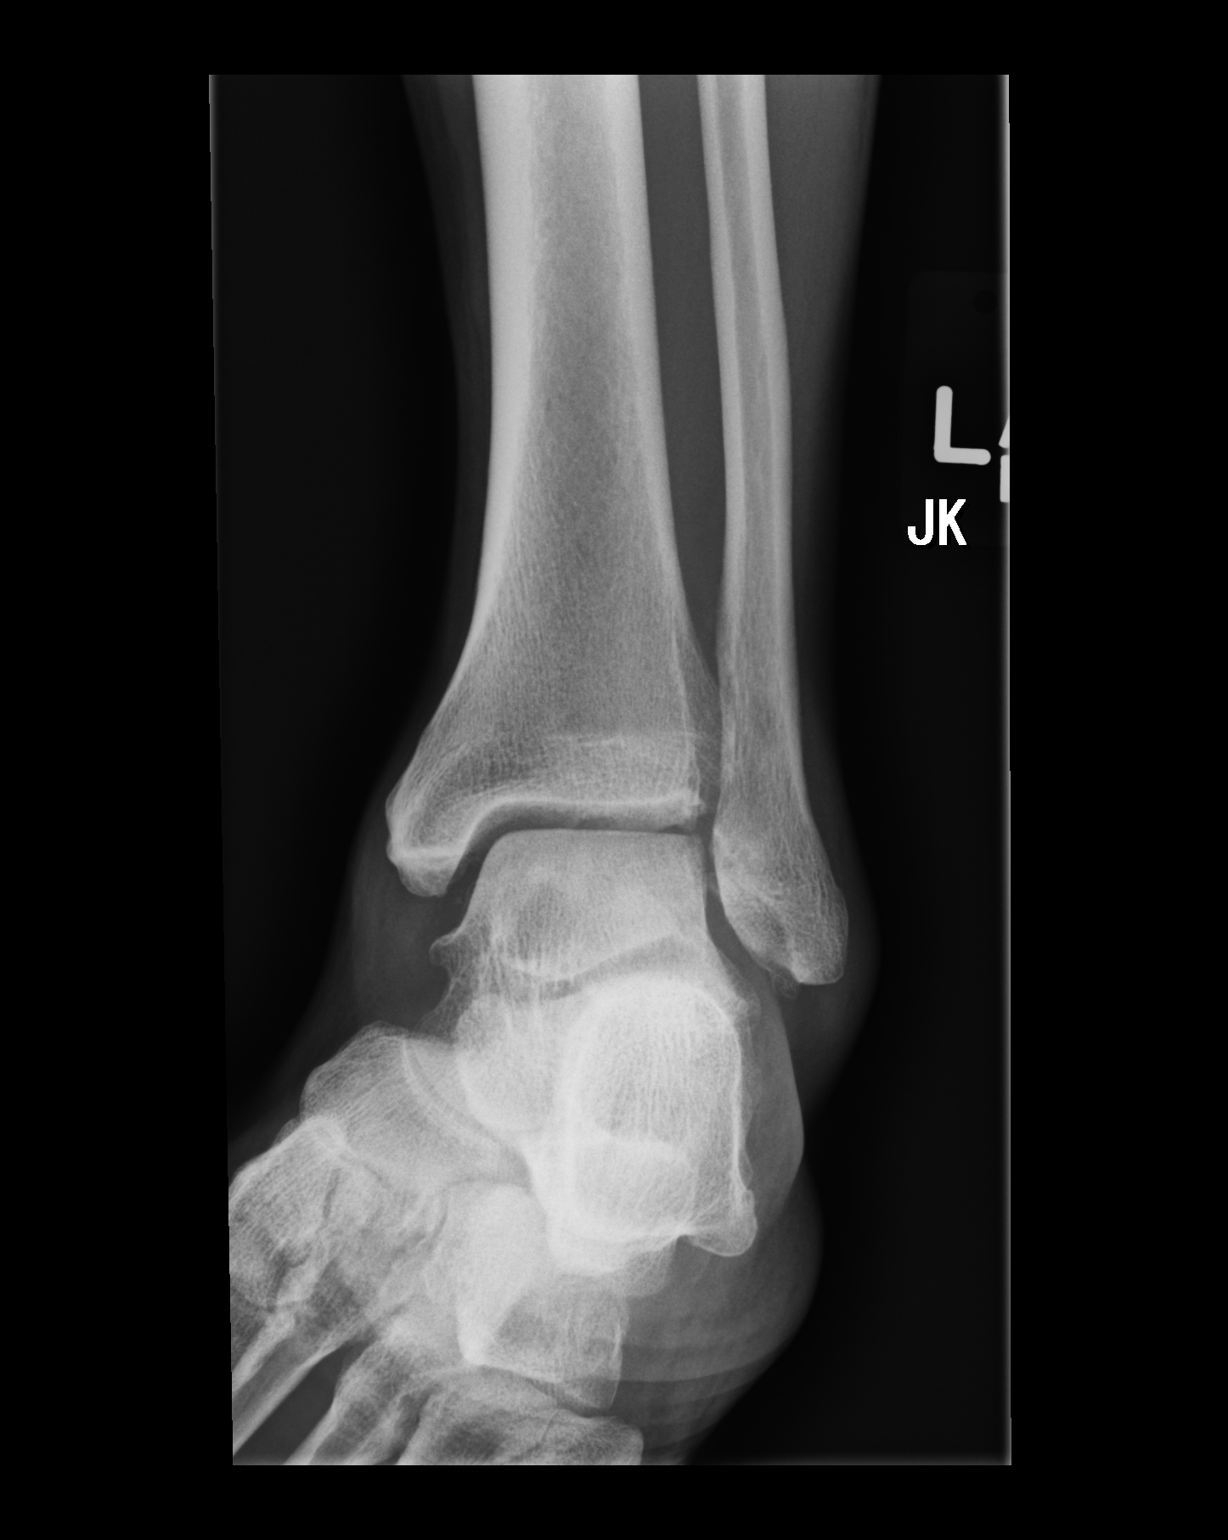

[dg ankle complete left (3 of 3)]
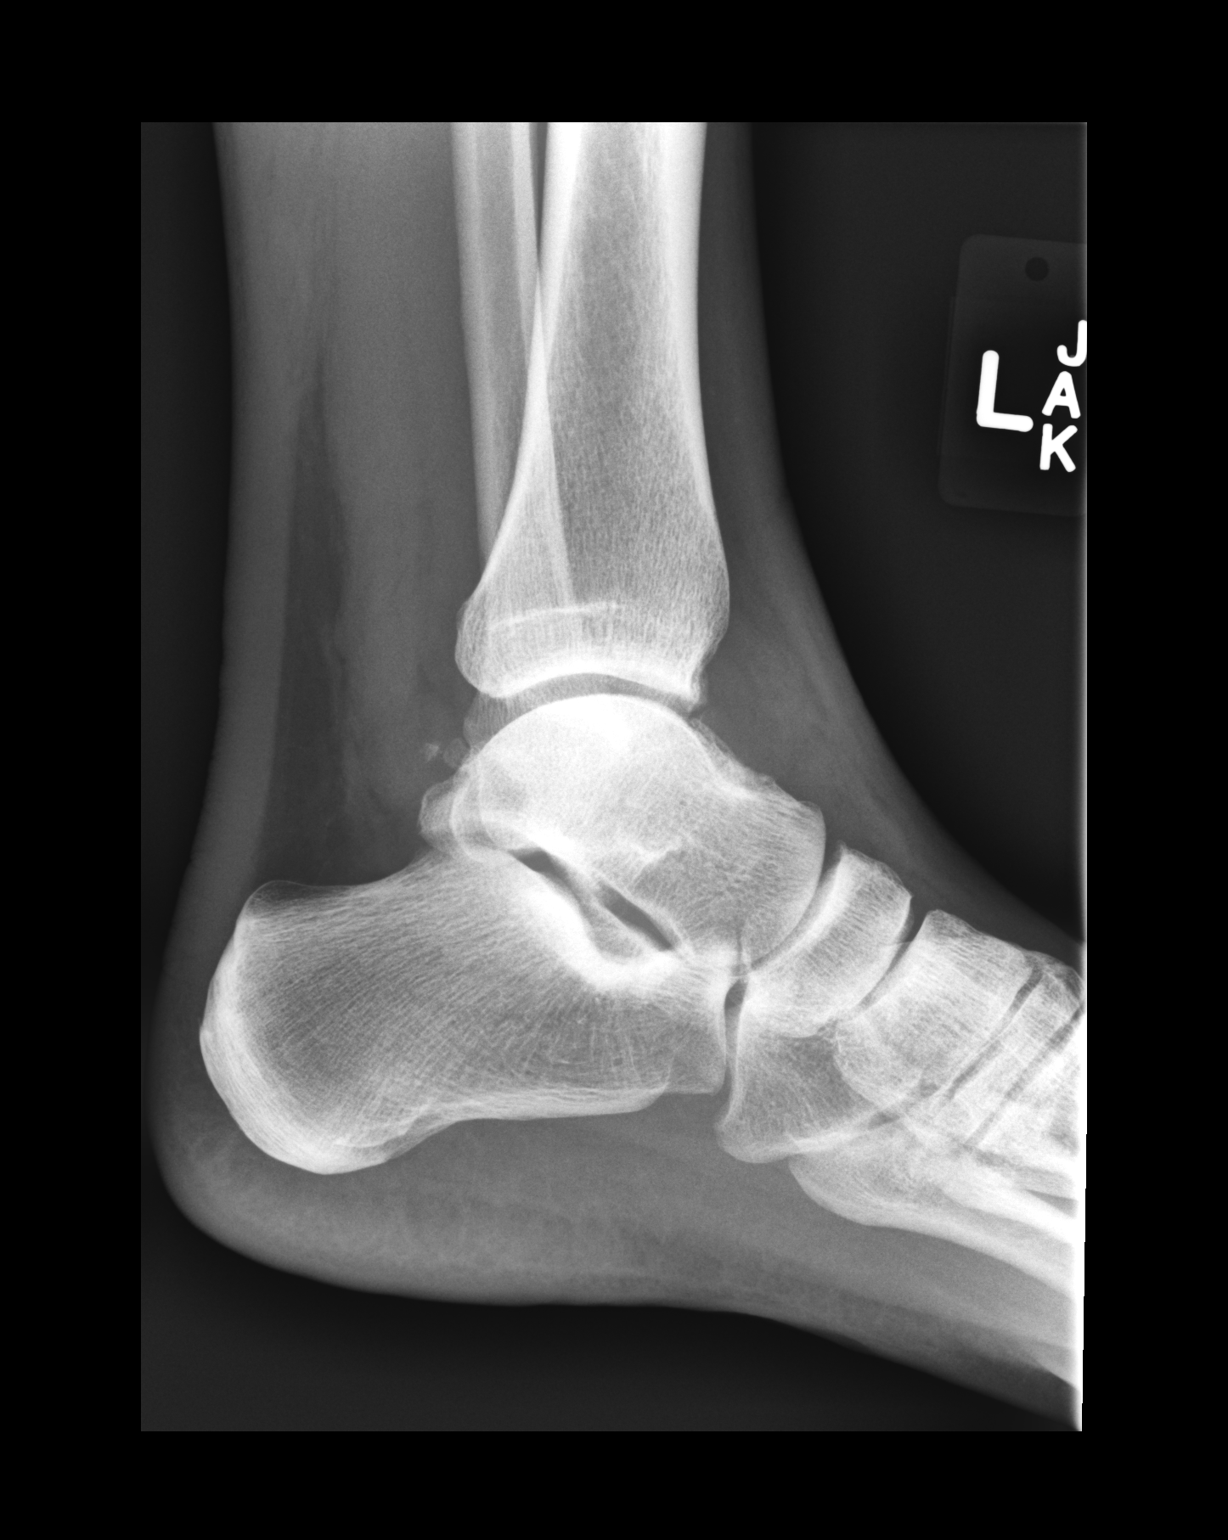

[3 of 3 positions shown; findings below may reference images not displayed]

FINDINGS: The bones are subjectively adequately mineralized. The ankle joint
mortise is preserved. The talar dome and remainder of the talus are
intact. There are bony densities noted adjacent to the posterior
aspect of the tibiotalar joint which may reflect old avulsion
fracture fragments or soft tissue calcifications. A small spur
arises from the inferior aspect of the distal fibula. The calcaneus
is intact. There is mild soft tissue swelling anteriorly and
laterally.
IMPRESSION: There are degenerative changes about the ankle. There no acute or
healing fracture. Possible old avulsion fracture fragments along the
posterior tibiotalar joint line.

## 2019-04-14 ENCOUNTER — Encounter: Payer: Self-pay | Admitting: Family Medicine

## 2019-04-14 ENCOUNTER — Other Ambulatory Visit: Payer: Self-pay

## 2019-04-14 ENCOUNTER — Ambulatory Visit (INDEPENDENT_AMBULATORY_CARE_PROVIDER_SITE_OTHER): Payer: Managed Care, Other (non HMO) | Admitting: Family Medicine

## 2019-04-14 DIAGNOSIS — R29818 Other symptoms and signs involving the nervous system: Secondary | ICD-10-CM | POA: Diagnosis not present

## 2019-04-14 DIAGNOSIS — J3089 Other allergic rhinitis: Secondary | ICD-10-CM | POA: Diagnosis not present

## 2019-04-14 MED ORDER — MONTELUKAST SODIUM 10 MG PO TABS: 10.0000 mg | ORAL_TABLET | Freq: Every day | ORAL | 3 refills | Status: DC

## 2019-04-14 NOTE — Progress Notes (Signed)
Virtual Visit via Telephone The purpose of this virtual visit is to provide medical care while limiting exposure to the novel coronavirus (COVID19) for both patient and office staff.  Consent was obtained for phone visit:  Yes.   Answered questions that patient had about telehealth interaction:  Yes.   I discussed the limitations, risks, security and privacy concerns of performing an evaluation and management service by telephone. I also discussed with the patient that there may be a patient responsible charge related to this service. The patient expressed understanding and agreed to proceed.  Patient Location: Home Provider Location: [facility])  ---------------------------------------------------------------------- Chief Complaint  Patient presents with  . Allergies    chronic allergies. last couple of weeks have been worse and pt has been snoring for x2 weeks that maybe associated with allergies.    S: Reviewed CMA documentation. I have called patient and gathered additional HPI as follows:  Chronic Allergic Rhinosinusitis Has history of chronic allergies seem to be year-round issue, he has been taking Allegra / anti histamine every day and using occasional OTC decongestant as well PRN - Now he reports worsening allergy symptoms lately past few weeks. Says he still takes Allegra regularly it is helping but not resolving problem. He takes Flonase regularly now but in past during non allergy seasons he tends to not take it every day. Admits symptoms with congestion and post nasal drip seems worse in AM with constant clearing throat  Admits sinus pressure and congestion Denies headache, fever chills nausea vomiting dyspnea cough  Additional complaint Snoring / Possible OSA  Snoring excessively loud thought maybe nasal congestion at first. Seems to have continued despite use of Flonase lately. He usually has had some mild snoring. There are times when he is  observed to have stopped breathing, as he has woken his girlfriend up while sleeping.  Denies any known or suspected exposure to person with or possibly with COVID19.  Denies any fevers, chills, sweats, body ache, cough, shortness of breath, sinus pain or pressure, headache, abdominal pain, diarrhea  History reviewed. No pertinent past medical history. Social History   Tobacco Use  . Smoking status: Never Smoker  . Smokeless tobacco: Never Used  Substance Use Topics  . Alcohol use: Yes  . Drug use: No    Current Outpatient Medications:  .  fexofenadine (ALLEGRA) 180 MG tablet, TAKE 1 TABLET BY MOUTH EVERY DAY, Disp: 30 tablet, Rfl: 5 .  fluticasone (FLONASE) 50 MCG/ACT nasal spray, Place 2 sprays into both nostrils daily. Use for 4-6 weeks then stop and use seasonally or as needed., Disp: 16 g, Rfl: 3 .  saw palmetto 160 MG capsule, Take 1 capsule (160 mg total) by mouth 2 (two) times daily., Disp: , Rfl:  .  amoxicillin-clavulanate (AUGMENTIN) 875-125 MG tablet, Take 1 tablet by mouth 2 (two) times daily. (Patient not taking: Reported on 04/14/2019), Disp: 20 tablet, Rfl: 0 .  montelukast (SINGULAIR) 10 MG tablet, Take 1 tablet (10 mg total) by mouth at bedtime., Disp: 90 tablet, Rfl: 3 .  mupirocin ointment (BACTROBAN) 2 %, Apply 1 application topically 2 (two) times daily. For up to 7-10 days, for flare up folliculitis (Patient not taking: Reported on 04/14/2019), Disp: 22 g, Rfl: 0  Depression screen [facility] 2/9 04/14/2019 02/13/2017 10/08/2016  Decreased Interest 0 0 0  Down, Depressed, Hopeless 0 0 1  PHQ - 2 Score 0 0 1    No flowsheet data found.  -------------------------------------------------------------------------- O: No physical  exam performed due to remote telephone encounter.  Lab results reviewed.  No results found for this or any previous visit (from the past 2160 hour(s)).  -------------------------------------------------------------------------- A&P:  Problem  List Items Addressed This Visit    Environmental and seasonal allergies - Primary    Subacute worsening in setting of chronic environmental allergies Spring/Fall worse seasons Recent worsening Spring season On Allegra year-round, on Flonase more intermittently but taking consistently  Plan - Add Singulair 10mg  nightly in addition to current therapy Continue Allegra 180mg  daily, Continue Flonase (use consistently 2 spray each nostril, don't skip doses now but can limit in non allergy season)  Discussed next option of consider add anti histamine nasal spray Azelastine/Astelin or combo Dymista if needed. Also consider future referral to Allergist / ENT if indicated in future.  Concern sinus allergies can contribute to his current breathing issue at night w snoring and possible sleep apnea      Relevant Medications   montelukast (SINGULAIR) 10 MG tablet    Other Visit Diagnoses    Seasonal allergic rhinitis due to other allergic trigger       Relevant Medications   montelukast (SINGULAIR) 10 MG tablet   Suspected sleep apnea         Concern may have possible sleep apnea, can be mild, but seems to have possible apnea episodes, however this seems only recent problem triggered by now difficulty breathing due to sinuses / allergies. - Discussed screening options for OSA, will defer further sleepiness scale OSA questionnaire today - will revisit these in future if worsening or not improve if allergy therapy does not improve.   Meds ordered this encounter  Medications  . montelukast (SINGULAIR) 10 MG tablet    Sig: Take 1 tablet (10 mg total) by mouth at bedtime.    Dispense:  90 tablet    Refill:  3    Follow-up: - Return in as needed  Patient verbalizes understanding with the above medical recommendations including the limitation of remote medical advice.  Specific follow-up and call-back criteria were given for patient to follow-up or seek medical care more urgently if  needed.   - Time spent in direct consultation with patient on phone: 12 minutes  [name], [facility] 04/14/2019, 1:50 PM

## 2019-04-14 NOTE — Patient Instructions (Addendum)
Start Montelukast (generic for Singulair) 10mg  nightly in addition to your regular Allegra and Flonase.  Try to use Flonase every day 2 sprays each nostril to avoid reduced benefit from it if you miss a dose.  In future we can consider adding Azelastine (Astelin) nose spray anti histamine similar to allegra but in nose spray you can take both. We can do in combo with Flonase and it's call Dymista or can do separate, let me know.  Otherwise next steps are consultation w/ referral to Specialist such as Allergist or ENT.  I am concerned about the sleep issue, I think you may be at risk for Obstructive Sleep Apnea, likely worse due to congestion from Allergies. So let's treat that first and then if still having sleep issue or fatigue tired sleepy dozing off during day we can certainly follow up on this issue and order a sleep study. At home or in sleep lab.  Please schedule a Follow-up Appointment to: Return if symptoms worsen or fail to improve.  If you have any other questions or concerns, please feel free to call the office or send a message through [facility]. You may also schedule an earlier appointment if necessary.  Additionally, you may be receiving a survey about your experience at our office within a few days to 1 week by e-mail or mail. We value your feedback.  [name], DO [place]

## 2019-04-14 NOTE — Assessment & Plan Note (Signed)
Subacute worsening in setting of chronic environmental allergies Spring/Fall worse seasons Recent worsening Spring season On Allegra year-round, on Flonase more intermittently but taking consistently  Plan - Add Singulair 10mg  nightly in addition to current therapy Continue Allegra 180mg  daily, Continue Flonase (use consistently 2 spray each nostril, don't skip doses now but can limit in non allergy season)  Discussed next option of consider add anti histamine nasal spray Azelastine/Astelin or combo Dymista if needed. Also consider future referral to Allergist / ENT if indicated in future.  Concern sinus allergies can contribute to his current breathing issue at night w snoring and possible sleep apnea

## 2019-04-25 ENCOUNTER — Ambulatory Visit: Payer: Managed Care, Other (non HMO) | Attending: Internal Medicine

## 2019-04-25 DIAGNOSIS — Z23 Encounter for immunization: Secondary | ICD-10-CM

## 2019-04-25 NOTE — Progress Notes (Signed)
   Covid-19 Vaccination Clinic  Name:  Mike Dean    MRN: [id number] DOB: Oct 08, 1970  04/25/2019  [name] was observed post Covid-19 immunization for 15 minutes without incident. He was provided with Vaccine Information Sheet and instruction to access the V-Safe system.   [name] was instructed to call 911 with any severe reactions post vaccine: Marland Kitchen Difficulty breathing  . Swelling of face and throat  . A fast heartbeat  . A bad rash all over body  . Dizziness and weakness   Immunizations Administered    Name Date Dose VIS Date Route   Pfizer COVID-19 Vaccine 04/25/2019 12:14 PM 0.3 mL 12/26/2018 Intramuscular   Manufacturer: [place]   Lot: [id number]   [place]: [id number]

## 2019-05-19 ENCOUNTER — Ambulatory Visit: Payer: Managed Care, Other (non HMO) | Attending: Internal Medicine

## 2019-05-19 DIAGNOSIS — Z23 Encounter for immunization: Secondary | ICD-10-CM

## 2019-05-19 NOTE — Progress Notes (Signed)
   Covid-19 Vaccination Clinic  Name:  Mike Dean    MRN: [id number] DOB: Aug 20, 1970  05/19/2019  [name] was observed post Covid-19 immunization for 15 minutes without incident. He was provided with Vaccine Information Sheet and instruction to access the V-Safe system.   [name] was instructed to call 911 with any severe reactions post vaccine: Marland Kitchen Difficulty breathing  . Swelling of face and throat  . A fast heartbeat  . A bad rash all over body  . Dizziness and weakness   Immunizations Administered    Name Date Dose VIS Date Route   Pfizer COVID-19 Vaccine 05/19/2019  3:38 PM 0.3 mL 03/11/2018 Intramuscular   Manufacturer: [place]   Lot: [id number]   [place]: [id number]

## 2020-01-11 ENCOUNTER — Ambulatory Visit: Payer: Managed Care, Other (non HMO) | Attending: Internal Medicine

## 2020-01-11 DIAGNOSIS — Z23 Encounter for immunization: Secondary | ICD-10-CM

## 2020-01-11 NOTE — Progress Notes (Signed)
   Covid-19 Vaccination Clinic  Name:  Mike Dean    MRN: [id number] DOB: [birth year]  01/11/2020  Mike Dean was observed post Covid-19 immunization for 15 minutes without incident. He was provided with Vaccine Information Sheet and instruction to access the V-Safe system.   Mike Dean was instructed to call 911 with any severe reactions post vaccine: Marland Kitchen Difficulty breathing  . Swelling of face and throat  . A fast heartbeat  . A bad rash all over body  . Dizziness and weakness   Immunizations Administered    Name Date Dose VIS Date Route   Pfizer COVID-19 Vaccine 01/11/2020  1:48 PM 0.3 mL 11/04/2019 Intramuscular   Manufacturer: [place]   Lot: [id number]   [place]: [id number]

## 2020-10-31 ENCOUNTER — Ambulatory Visit
Admission: RE | Admit: 2020-10-31 | Discharge: 2020-10-31 | Disposition: A | Discharge status: Home / Self Care | Payer: PRIVATE HEALTH INSURANCE | Source: Ambulatory Visit | Attending: Emergency Medicine | Admitting: Emergency Medicine

## 2020-10-31 ENCOUNTER — Other Ambulatory Visit: Payer: Self-pay

## 2020-10-31 VITALS — BP 114/72 | HR 90 | Temp 99.5°F

## 2020-10-31 DIAGNOSIS — B349 Viral infection, unspecified: Secondary | ICD-10-CM | POA: Diagnosis not present

## 2020-10-31 NOTE — ED Triage Notes (Signed)
Pt c/o cough, fever, chest congestion, bodyaches and loss of appetite x 4 days

## 2020-10-31 NOTE — ED Provider Notes (Signed)
Mike Dean    CSN: [id number] Arrival date & time: 10/31/20  1419      History   Chief Complaint Chief Complaint  Patient presents with   Cough   Fever   Chest Congestion     HPI Mike Dean is a 50 y.o. male.  Patient presents with 4-day history of fever, body aches, congestion, cough, decreased appetite.  T-max 102.5.  Treatment with Tylenol and ibuprofen.  He denies shortness of breath, vomiting, diarrhea, or other symptoms.  His medical history includes seasonal allergies, chronic left ankle pain, BPH.  The history is provided by the patient and medical records.   History reviewed. No pertinent past medical history.  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 No pertinent surgical history.     Home Medications    Prior to Admission medications   Medication Sig Start Date End Date Taking? Authorizing Provider  fexofenadine (ALLEGRA) 180 MG tablet TAKE 1 TABLET BY MOUTH EVERY DAY 01/17/18  Yes Karamalegos, Devonne Doughty, DO  fluticasone (FLONASE) 50 MCG/ACT nasal spray Place 2 sprays into both nostrils daily. Use for 4-6 weeks then stop and use seasonally or as needed. 02/19/17  Yes Karamalegos, Alexander J, DO  montelukast (SINGULAIR) 10 MG tablet Take 1 tablet (10 mg total) by mouth at bedtime. 04/14/19  Yes Karamalegos, Devonne Doughty, DO  saw palmetto 160 MG capsule Take 1 capsule (160 mg total) by mouth 2 (two) times daily. 10/08/16  Yes Karamalegos, Devonne Doughty, DO  amoxicillin-clavulanate (AUGMENTIN) 875-125 MG tablet Take 1 tablet by mouth 2 (two) times daily. Patient not taking: Reported on 04/14/2019 11/14/17   Olin Hauser, DO   mupirocin ointment (BACTROBAN) 2 % Apply 1 application topically 2 (two) times daily. For up to 7-10 days, for flare up folliculitis Patient not taking: Reported on 04/14/2019 11/14/17   Olin Hauser, DO    Family History Family History  Problem Relation Age of Onset   Stroke Mother    Bipolar disorder Sister    Breast cancer Maternal Grandmother    Throat cancer Maternal Grandfather        Smoker   Colon cancer Other 84   Prostate cancer Neg Hx     Social History Social History   Tobacco Use   Smoking status: Never   Smokeless tobacco: Never  Vaping Use   Vaping Use: Never used  Substance Use Topics   Alcohol use: Yes   Drug use: No     Allergies   Patient has no known allergies.   Review of Systems Review of Systems  Constitutional:  Positive for appetite change and fever. Negative for chills.  HENT:  Positive for congestion. Negative for ear pain and sore throat.   Respiratory:  Positive for cough. Negative for shortness of breath.   Cardiovascular:  Negative for chest pain and palpitations.  Gastrointestinal:  Negative for abdominal pain, diarrhea and vomiting.  Skin:  Negative for color change and rash.  All other systems reviewed and are negative.   Physical Exam Triage Vital Signs ED Triage Vitals  Enc Vitals Group     BP      Pulse      Resp  Temp      Temp src      SpO2      Weight      Height      Head Circumference      Peak Flow      Pain Score      Pain Loc      Pain Edu?      Excl. in [place]?    No data found.  Updated Vital Signs BP 114/72 (BP Location: Right Arm)   Pulse 90   Temp 99.5 F (37.5 C) (Oral)   SpO2 95%   Visual Acuity Right Eye Distance:   Left Eye Distance:   Bilateral Distance:    Right Eye Near:   Left Eye Near:    Bilateral Near:     Physical Exam Vitals and nursing note reviewed.  Constitutional:      General: He is not in acute distress.    Appearance: He is well-developed.  HENT:      Head: Normocephalic and atraumatic.     Right Ear: Tympanic membrane normal.     Left Ear: Tympanic membrane normal.     Nose: Nose normal.     Mouth/Throat:     Mouth: Mucous membranes are moist.     Pharynx: Oropharynx is clear.  Eyes:     Conjunctiva/sclera: Conjunctivae normal.  Cardiovascular:     Rate and Rhythm: Normal rate and regular rhythm.     Heart sounds: Normal heart sounds.  Pulmonary:     Effort: Pulmonary effort is normal. No respiratory distress.     Breath sounds: Normal breath sounds.  Abdominal:     Palpations: Abdomen is soft.     Tenderness: There is no abdominal tenderness.  Musculoskeletal:     Cervical back: Neck supple.  Skin:    General: Skin is warm and dry.  Neurological:     General: No focal deficit present.     Mental Status: He is alert and oriented to person, place, and time.     Gait: Gait normal.  Psychiatric:        Mood and Affect: Mood normal.        Behavior: Behavior normal.     UC Treatments / Results  Labs (all labs ordered are listed, but only abnormal results are displayed) Labs Reviewed  COVID-19, FLU A+B NAA    EKG   Radiology No results found.  Procedures Procedures (including critical care time)  Medications Ordered in UC Medications - No data to display  Initial Impression / Assessment and Plan / UC Course  I have reviewed the triage vital signs and the nursing notes.  Pertinent labs & imaging results that were available during my care of the patient were reviewed by me and considered in my medical decision making (see chart for details).   Viral illness.  COVID and Flu pending.  Instructed patient to self quarantine per CDC guidelines.  Discussed symptomatic treatment including Tylenol or ibuprofen, rest, hydration.  Instructed patient to follow up with PCP if symptoms are not improving.  Patient agrees to plan of care.    Final Clinical Impressions(s) / UC Diagnoses   Final diagnoses:  Viral illness      Discharge Instructions      Your COVID and Flu tests are pending.  You should self quarantine until the test results are back.    Take Tylenol or ibuprofen as needed for fever or discomfort.  Rest and keep yourself hydrated.  Follow-up with your primary care provider if your symptoms are not improving.         ED Prescriptions   None    PDMP not reviewed this encounter.   Sharion Balloon, NP 10/31/20 1452

## 2020-10-31 NOTE — Discharge Instructions (Signed)
Your COVID and Flu tests are pending.  You should self quarantine until the test results are back.    Take Tylenol or ibuprofen as needed for fever or discomfort.  Rest and keep yourself hydrated.    Follow-up with your primary care provider if your symptoms are not improving.     

## 2020-11-02 LAB — COVID-19, FLU A+B NAA
Influenza A, NAA: DETECTED — AB
Influenza B, NAA: NOT DETECTED
SARS-CoV-2, NAA: NOT DETECTED

## 2020-11-21 ENCOUNTER — Encounter: Payer: Self-pay | Admitting: Family Medicine

## 2020-11-23 ENCOUNTER — Other Ambulatory Visit: Payer: Self-pay

## 2020-11-23 ENCOUNTER — Ambulatory Visit: Payer: Managed Care, Other (non HMO) | Admitting: Family Medicine

## 2020-11-23 ENCOUNTER — Encounter: Payer: Self-pay | Admitting: Family Medicine

## 2020-11-23 DIAGNOSIS — R310 Gross hematuria: Secondary | ICD-10-CM | POA: Diagnosis not present

## 2020-11-23 DIAGNOSIS — N401 Enlarged prostate with lower urinary tract symptoms: Secondary | ICD-10-CM | POA: Diagnosis not present

## 2020-11-23 DIAGNOSIS — N138 Other obstructive and reflux uropathy: Secondary | ICD-10-CM | POA: Diagnosis not present

## 2020-11-23 DIAGNOSIS — R350 Frequency of micturition: Secondary | ICD-10-CM | POA: Diagnosis not present

## 2020-11-23 LAB — POCT URINALYSIS DIPSTICK
Bilirubin, UA: NEGATIVE
Glucose, UA: NEGATIVE
Ketones, UA: NEGATIVE
Leukocytes, UA: NEGATIVE
Nitrite, UA: NEGATIVE
Protein, UA: POSITIVE — AB
Spec Grav, UA: 1.025 (ref 1.010–1.025)
Urobilinogen, UA: 0.2 E.U./dL
pH, UA: 5 (ref 5.0–8.0)

## 2020-11-23 NOTE — Progress Notes (Signed)
Virtual Visit via Telephone The purpose of this virtual visit is to provide medical care while limiting exposure to the novel coronavirus (COVID19) for both patient and office staff.  Consent was obtained for phone visit:  Yes.   Answered questions that patient had about telehealth interaction:  Yes.   I discussed the limitations, risks, security and privacy concerns of performing an evaluation and management service by telephone. I also discussed with the patient that there may be a patient responsible charge related to this service. The patient expressed understanding and agreed to proceed.  Patient Location: Home Provider Location: [facility] (Office)  Participants in virtual visit: - Patient: Mike Dean - CMA: Orinda Kenner, [place] - Provider: Dr Parks Ranger  ---------------------------------------------------------------------- Chief Complaint  Patient presents with   Hematuria    S: Reviewed CMA documentation. I have called patient and gathered additional HPI as follows:  Gross Hematuria BPH LUTS  Reports that symptoms started 2 weeks ago, usually on in AM. He says only in AM when he has it. He will take a shower, and then he says he notices a small spot of blood on floor after dripping, and he has noticed drop of blood at urethra. He says occasional mild discomfort with urination. No further symptoms since Monday. Only 2 episodes only in 1 week then has resolved. No prior history of blood in urine No significant kidney stone history Known history of BPH moderate enlarged prostate, and he has been on saw palmetto OTC option He does admit physical strenuous activity and was asking about exercise causing blood in urine.  Denies any fevers, chills, sweats, body ache, cough, shortness of breath, sinus pain or pressure, headache, abdominal pain, diarrhea  No past medical history on file. Social History   Tobacco Use   Smoking status: Never    Smokeless tobacco: Never  Vaping Use   Vaping Use: Never used  Substance Use Topics   Alcohol use: Yes   Drug use: No    Current Outpatient Medications:    fexofenadine (ALLEGRA) 180 MG tablet, TAKE 1 TABLET BY MOUTH EVERY DAY, Disp: 30 tablet, Rfl: 5   fluticasone (FLONASE) 50 MCG/ACT nasal spray, Place 2 sprays into both nostrils daily. Use for 4-6 weeks then stop and use seasonally or as needed., Disp: 16 g, Rfl: 3   montelukast (SINGULAIR) 10 MG tablet, Take 1 tablet (10 mg total) by mouth at bedtime., Disp: 90 tablet, Rfl: 3   mupirocin ointment (BACTROBAN) 2 %, Apply 1 application topically 2 (two) times daily. For up to 7-10 days, for flare up folliculitis (Patient not taking: Reported on 04/14/2019), Disp: 22 g, Rfl: 0   saw palmetto 160 MG capsule, Take 1 capsule (160 mg total) by mouth 2 (two) times daily., Disp: , Rfl:   Depression screen [facility] 2/9 04/14/2019 02/13/2017 10/08/2016  Decreased Interest 0 0 0  Down, Depressed, Hopeless 0 0 1  PHQ - 2 Score 0 0 1    No flowsheet data found.  -------------------------------------------------------------------------- O: No physical exam performed due to remote telephone encounter.  Lab results reviewed.  Recent Results (from the past 2160 hour(s))  Coronavirus (COVID-19) with Influenza A and Influenza B     Status: Abnormal   Collection Time: 10/31/20  2:39 PM   Specimen: Nasopharyngeal   Naso  Release to patie  Result Value Ref Range   SARS-CoV-2, NAA Not Detected Not Detected   Influenza A, NAA Detected (A) Not Detected   Influenza B,  NAA Not Detected Not Detected   Test Information: Comment     Comment: This nucleic acid amplification test was developed and its performance characteristics determined by [employer]. Nucleic acid amplification tests include RT-PCR and TMA. This test has not been FDA cleared or approved. This test has been authorized by FDA under an Emergency Use Authorization (EUA). This test is  only authorized for the duration of time the declaration that circumstances exist justifying the authorization of the emergency use of in vitro diagnostic tests for detection of SARS-CoV-2 virus and/or diagnosis of COVID-19 infection under section 564(b)(1) of the Act, 21 U.S.C. 834HDQ-2(I) (1), unless the authorization is terminated or revoked sooner. When diagnostic testing is negative, the possibility of a false negative result should be considered in the context of a patient's recent exposures and the presence of clinical signs and symptoms consistent with COVID-19. An individual without symptoms of COVID-19 and who is not shedding SARS-CoV-2 virus wo uld expect to have a negative (not detected) result in this assay.   POCT Urinalysis Dipstick     Status: Abnormal   Collection Time: 11/23/20  8:31 AM  Result Value Ref Range   Color, UA Yellow    Clarity, UA Cloudy    Glucose, UA Negative Negative   Bilirubin, UA Negative    Ketones, UA Negative    Spec Grav, UA 1.025 1.010 - 1.025   Blood, UA Trace    pH, UA 5.0 5.0 - 8.0   Protein, UA Positive (A) Negative   Urobilinogen, UA 0.2 0.2 or 1.0 E.U./dL   Nitrite, UA Negative    Leukocytes, UA Negative Negative   Appearance     Odor      -------------------------------------------------------------------------- A&P:  Problem List Items Addressed This Visit     BPH with obstruction/lower urinary tract symptoms   Other Visit Diagnoses     Intermittent gross hematuria    -  Primary   Urinary frequency       Relevant Orders   POCT Urinalysis Dipstick (Completed)   Urine Culture      Clinically with reported gross hematuria x 2 in 1 week has resolved without prior history of similar episodes  UA only trace blood today  - Concern for significant pathology, and cannot rule out potential malignancy (kidney, bladder) however lower risk since never smoker - Differential includes more likely options of BPH caused bleeding,  strenuous exertion, UTI, Kidney stone.   Plan: Defer antibiotics until urine culture result comes back, no symptoms of UTI at this time, if negative then likely other etiology Mutual agree to monitor closely for symptoms for 1-2 weeks, and ultimately my recommendation is referral to [facility] for evaluation of gross hematuria - discussion today that he would likely need further work-up with imaging, CT hematuria protocol, and cystoscopy to potentially rule out malignancy or other serious pathology given gross hematuria   No orders of the defined types were placed in this encounter.   Follow-up: notify office in 2 weeks regarding decision on referral to Urology / if recurrent episodes.  Patient verbalizes understanding with the above medical recommendations including the limitation of remote medical advice.  Specific follow-up and call-back criteria were given for patient to follow-up or seek medical care more urgently if needed.   - Time spent in direct consultation with patient on phone: 17 minutes   [name], [facility] 11/23/2020, 4:32 PM

## 2020-11-23 NOTE — Patient Instructions (Addendum)
Contact our office sooner if need, otherwise please let us know on mychart / phone call in 1-2 weeks if you are interested to pursue the Urology referral for consult to evaluate further for underlying causes of the bleeding. As reviewed, any time we see actual blood in this scenario it should be investigated, and they may or may not pursue all the testing options.   Hematuria, Adult Hematuria is blood in the urine. Blood may be visible in the urine, or it may be identified with a test. This condition can be caused by infections of the bladder, urethra, kidney, or prostate. Other possible causes include: Kidney stones. Cancer of the urinary tract. Too much calcium in the urine. Conditions that are passed from parent to child (inherited conditions). Exercise that requires a lot of energy. Infections can usually be treated with medicine, and a kidney stone usually will pass through your urine. If neither of these is the cause of your hematuria, more tests may be needed to identify the cause of your symptoms. It is very important to tell your health care provider about any blood in your urine, even if it is painless or the blood stops without treatment. Blood in the urine, when it happens and then stops and then happens again, can be a symptom of a very serious condition, including cancer. There is no pain in the initial stages of many urinary cancers. Follow these instructions at home: Medicines Take over-the-counter and prescription medicines only as told by your health care provider. If you were prescribed an antibiotic medicine, take it as told by your health care provider. Do not stop taking the antibiotic even if you start to feel better. Eating and drinking Drink enough fluid to keep your urine pale yellow. It is recommended that you drink 3-4 quarts (2.8-3.8 L) a day. If you have been diagnosed with an infection, drinking cranberry juice in addition to large amounts of water is  recommended. Avoid caffeine, tea, and carbonated beverages. These tend to irritate the bladder. Avoid alcohol because it may irritate the prostate (in males). General instructions If you have been diagnosed with a kidney stone, follow your health care provider's instructions about straining your urine to catch the stone. Empty your bladder often. Avoid holding urine for long periods of time. If you are male: After a bowel movement, wipe from front to back and use each piece of toilet paper only once. Empty your bladder before and after sex. Pay attention to any changes in your symptoms. Tell your health care provider about any changes or any new symptoms. It is up to you to get the results of any tests. Ask your health care provider, or the department that is doing the test, when your results will be ready. Keep all follow-up visits. This is important. Contact a health care provider if: You develop back pain. You have a fever or chills. You have nausea or vomiting. Your symptoms do not improve after 3 days. Your symptoms get worse. Get help right away if: You develop severe vomiting and are unable to take medicine without vomiting. You develop severe pain in your back or abdomen even though you are taking medicine. You pass a large amount of blood in your urine. You pass blood clots in your urine. You feel very weak or like you might faint. You faint. Summary Hematuria is blood in the urine. It has many possible causes. It is very important that you tell your health care provider about any blood in  your urine, even if it is painless or the blood stops without treatment. Take over-the-counter and prescription medicines only as told by your health care provider. Drink enough fluid to keep your urine pale yellow. This information is not intended to replace advice given to you by your health care provider. Make sure you discuss any questions you have with your health care  provider. Document Revised: 09/02/2019 Document Reviewed: 09/02/2019 Elsevier Patient Education  [place].   Please schedule a Follow-up Appointment to: Return in about 2 weeks (around 12/07/2020), or if symptoms worsen or fail to improve, for hematuria.  If you have any other questions or concerns, please feel free to call the office or send a message through [place]. You may also schedule an earlier appointment if necessary.  Additionally, you may be receiving a survey about your experience at our office within a few days to 1 week by e-mail or mail. We value your feedback.  [name], DO [place]

## 2020-11-25 LAB — URINE CULTURE
MICRO NUMBER:: 12617830
Result:: NO GROWTH
SPECIMEN QUALITY:: ADEQUATE

## 2020-12-07 ENCOUNTER — Encounter: Payer: Self-pay | Admitting: Family Medicine

## 2020-12-07 DIAGNOSIS — R310 Gross hematuria: Secondary | ICD-10-CM

## 2020-12-26 ENCOUNTER — Ambulatory Visit (INDEPENDENT_AMBULATORY_CARE_PROVIDER_SITE_OTHER): Payer: PRIVATE HEALTH INSURANCE | Admitting: Urology

## 2020-12-26 ENCOUNTER — Other Ambulatory Visit: Payer: Self-pay

## 2020-12-26 ENCOUNTER — Encounter: Payer: Self-pay | Admitting: Urology

## 2020-12-26 VITALS — BP 137/80 | HR 65 | Ht 74.0 in | Wt 235.0 lb

## 2020-12-26 DIAGNOSIS — N368 Other specified disorders of urethra: Secondary | ICD-10-CM | POA: Diagnosis not present

## 2020-12-26 DIAGNOSIS — N401 Enlarged prostate with lower urinary tract symptoms: Secondary | ICD-10-CM

## 2020-12-26 LAB — URINALYSIS, COMPLETE
Bilirubin, UA: NEGATIVE
Glucose, UA: NEGATIVE
Ketones, UA: NEGATIVE
Leukocytes,UA: NEGATIVE
Nitrite, UA: NEGATIVE
Protein,UA: NEGATIVE
Specific Gravity, UA: 1.02 (ref 1.005–1.030)
Urobilinogen, Ur: 0.2 mg/dL (ref 0.2–1.0)
pH, UA: 5.5 (ref 5.0–7.5)

## 2020-12-26 LAB — MICROSCOPIC EXAMINATION
Bacteria, UA: NONE SEEN
Epithelial Cells (non renal): NONE SEEN /hpf (ref 0–10)

## 2020-12-26 NOTE — Progress Notes (Signed)
   12/26/2020 9:34 AM   Bayard Males [birth year] [id number]  Referring provider: Olin Hauser, DO [address] [place],  [place] [address]  Chief Complaint  Patient presents with   Hematuria    HPI: Mike Dean is a 50 y.o. male referred for evaluation of gross hematuria.  2 episodes of small drop of blood at the penis after voiding which occurred late October/early November No gross hematuria per se Prior history of BPH on saw palmetto Occasional mild dysuria Dipstick UA with trace blood, no microscopy ordered; urine culture negative Voiding symptoms include frequency, urgency, hesitancy and decreased stream IPSS 16/35    PMH: History reviewed. No pertinent past medical history.  Surgical History: History reviewed. No pertinent surgical history.  Home Medications:  Allergies as of 12/26/2020   No Known Allergies      Medication List        Accurate as of December 26, 2020  9:34 AM. If you have any questions, ask your nurse or doctor.          STOP taking these medications    mupirocin ointment 2 % Commonly known as: Bactroban Stopped by: Abbie Sons, MD       TAKE these medications    fexofenadine 180 MG tablet Commonly known as: ALLEGRA TAKE 1 TABLET BY MOUTH EVERY DAY   fluticasone 50 MCG/ACT nasal spray Commonly known as: FLONASE Place 2 sprays into both nostrils daily. Use for 4-6 weeks then stop and use seasonally or as needed.   montelukast 10 MG tablet Commonly known as: SINGULAIR Take 1 tablet (10 mg total) by mouth at bedtime.   saw palmetto 160 MG capsule Take 1 capsule (160 mg total) by mouth 2 (two) times daily.        Allergies: No Known Allergies  Family History: Family History  Problem Relation Age of Onset   Stroke Mother    Bipolar disorder Sister    Breast cancer Maternal Grandmother    Throat cancer Maternal Grandfather        Smoker   Colon cancer Other 17   Prostate cancer Neg Hx     Social  History:  reports that he has never smoked. He has never used smokeless tobacco. He reports current alcohol use. He reports that he does not use drugs.   Physical Exam: BP 137/80   Pulse 65   Ht 6\' 2"  (1.88 m)   Wt 235 lb (106.6 kg)   BMI 30.17 kg/m   Constitutional:  Alert and oriented, No acute distress. HEENT: [place] AT, moist mucus membranes.  Trachea midline, no masses. Cardiovascular: No clubbing, cyanosis, or edema. Respiratory: Normal respiratory effort, no increased work of breathing. GU: prostate 50 g, smooth without nodules Skin: No rashes, bruises or suspicious lesions. Psychiatric: Normal mood and affect.  Laboratory Data:  Urinalysis Dipstick trace blood Microscopy negative   Assessment & Plan:    1.  Urethral bleeding Isolated episode; no microhematuria Most likely secondary to BPH.  Does have obstructive voiding symptoms and discussed possibility of urethral stricture Schedule cystoscopy for lower tract evaluation  2.  BPH with LUTS Cystoscopy as above Does not desire additional treatment for his voiding symptoms   Abbie Sons, MD  [facility] [address], [place], [place] [address] [phone]

## 2021-01-26 ENCOUNTER — Other Ambulatory Visit: Payer: PRIVATE HEALTH INSURANCE | Admitting: Urology

## 2021-06-22 ENCOUNTER — Encounter: Payer: Self-pay | Admitting: Family Medicine

## 2021-06-23 ENCOUNTER — Encounter: Payer: Self-pay | Admitting: Family Medicine

## 2021-06-23 ENCOUNTER — Telehealth (INDEPENDENT_AMBULATORY_CARE_PROVIDER_SITE_OTHER): Payer: PRIVATE HEALTH INSURANCE | Admitting: Family Medicine

## 2021-06-23 VITALS — Wt 235.0 lb

## 2021-06-23 DIAGNOSIS — K219 Gastro-esophageal reflux disease without esophagitis: Secondary | ICD-10-CM | POA: Diagnosis not present

## 2021-06-23 MED ORDER — OMEPRAZOLE 20 MG PO CPDR: 20.0000 mg | DELAYED_RELEASE_CAPSULE | Freq: Every day | ORAL | 3 refills | Status: DC

## 2021-06-23 NOTE — Progress Notes (Signed)
Subjective:    Patient ID: Mike Dean, male    DOB: 11/01/1970, 51 y.o.   MRN: [id number]  Mike Dean is a 51 y.o. male presenting on 06/23/2021 for Gastroesophageal Reflux  Virtual / Telehealth Encounter - Video Visit via MyChart The purpose of this virtual visit is to provide medical care while limiting exposure to the novel coronavirus (COVID19) for both patient and office staff.  Consent was obtained for remote visit:  Yes.   Answered questions that patient had about telehealth interaction:  Yes.   I discussed the limitations, risks, security and privacy concerns of performing an evaluation and management service by video/telephone. I also discussed with the patient that there may be a patient responsible charge related to this service. The patient expressed understanding and agreed to proceed.  Patient Location: Home Provider Location: [facility] (Office)  Participants in virtual visit: - Patient: Mike Dean - CMA: Orinda Kenner, CMA - Provider: Dr Parks Ranger   HPI  GERD Reports issue for few years with chronic acid reflux, persistent heartburn. Has taken it daily for 2 years. He does very well on Omeprazole OTC '20mg'$  daily. It controls his symptoms. He admits some food triggers worsen it. He has tried to reduce coffee from 8 oz cups x 4 down to 1-2, some improved. He tried to DC it cold [place] but had worsening symptoms, he asks about long term side effects or problems.      06/23/2021    3:31 PM 04/14/2019    1:33 PM 02/13/2017    9:00 AM  Depression screen PHQ 2/9  Decreased Interest 0 0 0  Down, Depressed, Hopeless 0 0 0  PHQ - 2 Score 0 0 0  Altered sleeping 0    Tired, decreased energy 0    Change in appetite 0    Feeling bad or failure about yourself  0    Trouble concentrating 0    Moving slowly or fidgety/restless 0    Suicidal thoughts 0    PHQ-9 Score 0    Difficult doing work/chores Not difficult at all      Social  History   Tobacco Use   Smoking status: Never   Smokeless tobacco: Never  Vaping Use   Vaping Use: Never used  Substance Use Topics   Alcohol use: Yes   Drug use: No    Review of Systems Per HPI unless specifically indicated above     Objective:    Wt 235 lb (106.6 kg)   BMI 30.17 kg/m   Wt Readings from Last 3 Encounters:  06/23/21 235 lb (106.6 kg)  12/26/20 235 lb (106.6 kg)  02/18/17 210 lb (95.3 kg)    Physical Exam  Note examination was completely remotely via video observation objective data only  Gen - well-appearing, no acute distress or apparent pain, comfortable HEENT - eyes appear clear without discharge or redness Heart/Lungs - cannot examine virtually - observed no evidence of coughing or labored breathing. Abd - cannot examine virtually  Skin - face visible today- no rash Neuro - awake, alert, oriented Psych - not anxious appearing   Results for orders placed or performed in visit on 12/26/20  Microscopic Examination   Urine  Result Value Ref Range   WBC, UA 0-5 0 - 5 /hpf   RBC 0-2 0 - 2 /hpf   Epithelial Cells (non renal) None seen 0 - 10 /hpf   Bacteria, UA None seen None seen/Few  Urinalysis, Complete  Result Value Ref Range   Specific Gravity, UA 1.020 1.005 - 1.030   pH, UA 5.5 5.0 - 7.5   Color, UA Yellow Yellow   Appearance Ur Clear Clear   Leukocytes,UA Negative Negative   Protein,UA Negative Negative/Trace   Glucose, UA Negative Negative   Ketones, UA Negative Negative   RBC, UA Trace (A) Negative   Bilirubin, UA Negative Negative   Urobilinogen, Ur 0.2 0.2 - 1.0 mg/dL   Nitrite, UA Negative Negative   Microscopic Examination See below:       Assessment & Plan:   Problem List Items Addressed This Visit   None Visit Diagnoses     Gastroesophageal reflux disease, unspecified whether esophagitis present    -  Primary   Relevant Medications   omeprazole (PRILOSEC) 20 MG capsule        Suspected chronic GERD Rebound  flare when tried to DC  On for 2+ years Some dietary trigger - History not suggestive of PUD  Plan: 1. Start rx Omeprazole '20mg'$  daily before 1st meal of day, continue longer term, 90 day with refills, may consider gradual taper off to avoid rebound in future. 2. Diet modifications reduce GERD, reduce coffee caffeine  Future follow up as indicated  Meds ordered this encounter  Medications   omeprazole (PRILOSEC) 20 MG capsule    Sig: Take 1 capsule (20 mg total) by mouth daily before breakfast.    Dispense:  90 capsule    Refill:  3      Follow up plan: Return if symptoms worsen or fail to improve.  Patient verbalizes understanding with the above medical recommendations including the limitation of remote medical advice.  Specific follow-up and call-back criteria were given for patient to follow-up or seek medical care more urgently if needed.  Total duration of direct patient care provided via video conference: 10 minutes   [name], [facility] 06/23/2021, 4:26 PM

## 2021-06-23 NOTE — Patient Instructions (Addendum)
Thank you for coming to the office today.  - Omeprazole '20mg'$  one capsule 30 min before first meal of day Okay to take longer term Discussed importance in future of retrial on taper off slowly / gradually over several weeks if interested  - Avoid spicy, greasy, fried foods, also things like caffeine, dark chocolate, peppermint can worsen - Avoid large meals and late night snacks, also do not go more than 4-5 hours without a snack or meal (not eating will worsen reflux symptoms due to stomach acid)  Caution anti inflammatory NSAID use (Ibuprofen, Aleve, Advil, Naproxen, BC goody powder) can worsen acid reflux.  If symptoms are worsening, persistent symptoms despite treatment or develop esophageal or abdominal pain, unable to swallow solids or liquids, nausea, vomiting, fever/chills, or unintentional weight loss / no appetite, please follow-up sooner or seek more immediate medical attention.   Please schedule a Follow-up Appointment to: Return if symptoms worsen or fail to improve.  If you have any other questions or concerns, please feel free to call the office or send a message through [place]. You may also schedule an earlier appointment if necessary.  Additionally, you may be receiving a survey about your experience at our office within a few days to 1 week by e-mail or mail. We value your feedback.  [name], DO [place]

## 2022-06-17 ENCOUNTER — Ambulatory Visit
Admission: EM | Admit: 2022-06-17 | Discharge: 2022-06-17 | Disposition: A | Discharge status: Home / Self Care | Payer: PRIVATE HEALTH INSURANCE | Attending: Emergency Medicine | Admitting: Emergency Medicine

## 2022-06-17 DIAGNOSIS — B029 Zoster without complications: Secondary | ICD-10-CM | POA: Diagnosis not present

## 2022-06-17 HISTORY — DX: Gastro-esophageal reflux disease without esophagitis: K21.9

## 2022-06-17 MED ORDER — VALACYCLOVIR HCL 1 G PO TABS: 1000.0000 mg | ORAL_TABLET | Freq: Three times a day (TID) | ORAL | 0 refills | Status: DC

## 2022-06-17 NOTE — ED Triage Notes (Signed)
Patient to Urgent Care with complaints of left sided rib pain/ rash that started four days ago. Rash appears like small blisters.

## 2022-06-17 NOTE — ED Provider Notes (Signed)
Mike Dean    CSN: [id number] Arrival date & time: 06/17/22  1034      History   Chief Complaint Chief Complaint  Patient presents with   Rash    HPI Mike Dean is a 52 y.o. male.  Patient presents with a painful rash on his left flank x 4 days.  The rash has continued to spread and he has new blisters today.  No fever, sore throat, cough, shortness of breath, or other symptoms.  No OTC medications taken today.  The history is provided by the patient and medical records.    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 Authorizing Provider  valACYclovir (VALTREX) 1000 MG tablet Take 1 tablet (1,000 mg total) by mouth 3 (three) times daily. 06/17/22  Yes Mickie Bail, NP  fexofenadine (ALLEGRA) 180 MG tablet TAKE 1 TABLET BY MOUTH EVERY DAY 01/17/18   Karamalegos, Netta Neat, DO  fluticasone (FLONASE) 50 MCG/ACT nasal spray Place 2 sprays into both nostrils daily. Use for 4-6 weeks then stop and use seasonally or as needed. 02/19/17   Karamalegos, Netta Neat, DO  montelukast (SINGULAIR) 10 MG tablet Take 1 tablet (10 mg total) by mouth at bedtime. 04/14/19   Karamalegos, Netta Neat, DO  omeprazole (PRILOSEC) 20 MG capsule Take 1 capsule (20 mg total) by mouth daily before breakfast. 06/23/21   Althea Charon, Netta Neat, DO  saw palmetto 160 MG capsule Take 1 capsule (160 mg total) by mouth 2 (two) times daily. 10/08/16    Smitty Cords, DO    Family History Family History  Problem Relation Age of Onset   Stroke Mother    Bipolar disorder Sister    Breast cancer Maternal Grandmother    Throat cancer Maternal Grandfather        Smoker   Colon cancer Other 49   Prostate cancer Neg Hx     Social History Social History   Tobacco Use   Smoking status: Never   Smokeless tobacco: Never  Vaping Use   Vaping Use: Never used  Substance Use Topics   Alcohol use: Yes    Comment: 1x weekly   Drug use: No     Allergies   Patient has no known allergies.   Review of Systems Review of Systems  Constitutional:  Negative for chills and fever.  HENT:  Negative for ear pain and sore throat.   Respiratory:  Negative for cough and shortness of breath.   Cardiovascular:  Negative for chest pain and palpitations.  Skin:  Positive for rash.     Physical Exam Triage Vital Signs ED Triage Vitals  Enc Vitals Group     BP 06/17/22 1101 121/81     Pulse Rate 06/17/22 1055 72     Resp 06/17/22 1055 18     Temp 06/17/22 1055 98.7 F (37.1 C)     Temp src --  SpO2 06/17/22 1055 95 %     Weight --      Height --      Head Circumference --      Peak Flow --      Pain Score 06/17/22 1059 2     Pain Loc --      Pain Edu? --      Excl. in GC? --    No data found.  Updated Vital Signs BP 121/81   Pulse 72   Temp 98.7 F (37.1 C)   Resp 18   SpO2 95%   Visual Acuity Right Eye Distance:   Left Eye Distance:   Bilateral Distance:    Right Eye Near:   Left Eye Near:    Bilateral Near:     Physical Exam Vitals and nursing note reviewed.  Constitutional:      General: He is not in acute distress.    Appearance: He is well-developed. He is not ill-appearing.  HENT:     Right Ear: Tympanic membrane normal.     Left Ear: Tympanic membrane normal.     Nose: Nose normal.     Mouth/Throat:     Mouth: Mucous membranes are moist.     Pharynx: Oropharynx is clear.   Cardiovascular:     Rate and Rhythm: Normal rate and regular rhythm.     Heart sounds: Normal heart sounds.  Pulmonary:     Effort: Pulmonary effort is normal. No respiratory distress.     Breath sounds: Normal breath sounds.  Musculoskeletal:     Cervical back: Neck supple.  Skin:    General: Skin is warm and dry.     Findings: Rash present.     Comments: Patchy vesicular rash of left flank which follows dermatome line.  Neurological:     Mental Status: He is alert.  Psychiatric:        Mood and Affect: Mood normal.        Behavior: Behavior normal.      UC Treatments / Results  Labs (all labs ordered are listed, but only abnormal results are displayed) Labs Reviewed - No data to display  EKG   Radiology No results found.  Procedures Procedures (including critical care time)  Medications Ordered in UC Medications - No data to display  Initial Impression / Assessment and Plan / UC Course  I have reviewed the triage vital signs and the nursing notes.  Pertinent labs & imaging results that were available during my care of the patient were reviewed by me and considered in my medical decision making (see chart for details).    Herpes zoster.  Treating with Valtrex.  Tylenol or ibuprofen as needed for fever or discomfort.  Instructed patient to follow-up with his PCP.  Education provided on shingles.  He agrees to plan of care.  Final Clinical Impressions(s) / UC Diagnoses   Final diagnoses:  Herpes zoster without complication     Discharge Instructions      Take the Valtrex as directed.  Follow up with your primary care provider.        ED Prescriptions     Medication Sig Dispense Auth. Provider   valACYclovir (VALTREX) 1000 MG tablet Take 1 tablet (1,000 mg total) by mouth 3 (three) times daily. 21 tablet Mickie Bail, NP      PDMP not reviewed this encounter.   Mickie Bail, NP 06/17/22 1135

## 2022-06-17 NOTE — Discharge Instructions (Addendum)
Take the Valtrex as directed.  Follow up with your primary care provider.    

## 2022-06-18 ENCOUNTER — Encounter: Payer: Self-pay | Admitting: Family Medicine

## 2022-06-20 ENCOUNTER — Ambulatory Visit (INDEPENDENT_AMBULATORY_CARE_PROVIDER_SITE_OTHER): Payer: PRIVATE HEALTH INSURANCE | Admitting: Family Medicine

## 2022-06-20 ENCOUNTER — Encounter: Payer: Self-pay | Admitting: Family Medicine

## 2022-06-20 VITALS — BP 112/74 | HR 71 | Temp 96.6°F | Wt 256.0 lb

## 2022-06-20 DIAGNOSIS — Z1211 Encounter for screening for malignant neoplasm of colon: Secondary | ICD-10-CM | POA: Diagnosis not present

## 2022-06-20 DIAGNOSIS — B029 Zoster without complications: Secondary | ICD-10-CM

## 2022-06-20 MED ORDER — TRIAMCINOLONE ACETONIDE 0.5 % EX CREA: 1.0000 | TOPICAL_CREAM | Freq: Two times a day (BID) | CUTANEOUS | 0 refills | Status: AC

## 2022-06-20 MED ORDER — GABAPENTIN 100 MG PO CAPS: ORAL_CAPSULE | ORAL | 0 refills | Status: DC

## 2022-06-20 NOTE — Patient Instructions (Addendum)
Thank you for coming to the office today.  Start the anti-viral medication - Valtrex take one 3 times a day for 7 days to complete the entire course. This will help the current flare up heal, including the rash to dry up and resolve quicker. It may take several days to weeks for the rash to completely resolve. Typically it will dry up and scab off.  Blister and Rash Care Take a cool bath or apply cool compresses to the area of the rash or blisters as directed by your health care provider. This may help with pain and itching. Keep your rash covered with a loose bandage (dressing). Wear loose-fitting clothing to help ease the pain of material rubbing against the rash. Keep your rash and blisters clean with mild soap and cool water or as directed by your health care provider. Check your rash every day for signs of infection. These include redness, swelling, and pain that lasts or increases. Do not pick your blisters. Do not scratch your rash.  As mentioned, some patients experience "Post-Herpetic Neuralgia" or a persistent sensation or feeling of "nerve irritation or pain" that can last for days to weeks to months or longer in this same area.  You may try the nerve medicine to help ease these symptoms now or in the future if it is needed. Try Gabapentin 100mg  capsules, take at night for 2-3 nights only, and then increase to 2 times a day for a few days, and then may increase to 3 times a day, it may make you drowsy, if helps significantly at night only, then you can increase instead to 3 capsules at night, instead of 3 times a day - In the future if needed, we can significantly increase the dose if tolerated well, some common doses are 300mg  three times a day up to 600mg  three times a day, usually it takes several weeks or months to get to higher doses  It is contagious to patients who have never had Chicken Pox - or someone who is pregnant (unborn baby is at risk) - or elderly with weakened immune  system. Try to avoid direct close contact with others on the skin if you have active blisters. Once the rash dries up and heals, it is less of a concern.  You may get the Shingles vaccine (Shingrix) approximately 6 weeks after the rash and episode is resolved. It is a two dose series of vaccines. You may get this at the pharmacy  Contact your doctor if: Your pain is not relieved with prescribed medicines. Your pain does not get better after the rash heals. Your rash looks infected. Signs of infection include redness, swelling, and pain that lasts or increases.  Please seek more immediate medical attention at the [facility] Emergency Department IF The rash is on your face or nose. You have facial pain, pain around your eye area, or loss of feeling on one side of your face. You have ear pain or you have ringing in your ear. You have loss of taste. Your condition gets worse.    ------------------------------------------------------------------------------------------------------- Please review the additional information below about Shingles, if you have additional concerns.  Shingles Shingles, which is also known as herpes zoster, is an infection that causes a painful skin rash and fluid-filled blisters. Shingles is not related to genital herpes, which is a sexually transmitted infection. Shingles only develops in people who: Have had chickenpox. Have received the chickenpox vaccine. (This is rare.)  What are the causes? Shingles is caused  by varicella-zoster virus (VZV). This is the same virus that causes chickenpox. After exposure to VZV, the virus stays in the body in an inactive (dormant) state. Shingles develops if the virus reactivates. This can happen many years after the initial exposure to VZV. It is not known what causes this virus to reactivate. What increases the risk? People who have had chickenpox or received the chickenpox vaccine are at risk for shingles. Infection is more  common in people who: Are older than age 3. Have a weakened defense (immune) system, such as those with HIV, AIDS, or cancer. Are taking medicines that weaken the immune system, such as transplant medicines. Are under great stress.  What are the signs or symptoms? Early symptoms of this condition include itching, tingling, and pain in an area on your skin. Pain may be described as burning, stabbing, or throbbing. A few days or weeks after symptoms start, a painful red rash appears, usually on one side of the body in a bandlike or beltlike pattern. The rash eventually turns into fluid-filled blisters that break open, scab over, and dry up in about 2-3 weeks. At any time during the infection, you may also develop: A fever. Chills. A headache. An upset stomach.  How is this diagnosed? This condition is diagnosed with a skin exam. Sometimes, skin or fluid samples are taken from the blisters before a diagnosis is made. These samples are examined under a microscope or sent to a lab for testing. How is this treated? There is no specific cure for this condition. Your health care provider will probably prescribe medicines to help you manage pain, recover more quickly, and avoid long-term problems. Medicines may include: Antiviral drugs. Anti-inflammatory drugs. Pain medicines.  If the area involved is on your face, you may be referred to a specialist, such as an eye doctor (ophthalmologist) or an ear, nose, and throat (ENT) doctor to help you avoid eye problems, chronic pain, or disability. Follow these instructions at home: Medicines Take medicines only as directed by your health care provider. Apply an anti-itch or numbing cream to the affected area as directed by your health care provider. General instructions Rest as directed by your health care provider. Keep all follow-up visits as directed by your health care provider. This is important. Until your blisters scab over, your infection can  cause chickenpox in people who have never had it or been vaccinated against it. To prevent this from happening, avoid contact with other people, especially: Babies. Pregnant women. Children who have eczema. Elderly people who have transplants. People who have chronic illnesses, such as leukemia or AIDS. This information is not intended to replace advice given to you by your health care provider. Make sure you discuss any questions you have with your health care provider. Document Released: 01/01/2005 Document Revised: 08/28/2015 Document Reviewed: 11/12/2013 Elsevier Interactive Patient Education  2018 [employer].  ----------------------------  Ordered the [employer] (home kit) test for colon cancer screening. Stay tuned for further updates.  It will be shipped to you directly. If not received in 2-4 weeks, call us or the company.   If you send it back and no results are received in 2-4 weeks, call us or the company as well!   Colon Cancer Screening: - For all adults age 32+ routine colon cancer screening is highly recommended.     - Recent guidelines from American Cancer Society recommend starting age of 59 - Early detection of colon cancer is important, because often there are no warning  signs or symptoms, also if found early usually it can be cured. Late stage is hard to treat.   - If Cologuard is NEGATIVE, then it is good for 3 years before next due - If Cologuard is POSITIVE, then it is strongly advised to get a Colonoscopy, which allows the GI doctor to locate the source of the cancer or polyp (even very early stage) and treat it by removing it. ------------------------- Follow instructions to collect sample, you may call the company for any help or questions, 24/7 telephone support at [phone].    Please schedule a Follow-up Appointment to: Return if symptoms worsen or fail to improve.  If you have any other questions or concerns, please feel free to call the office or  send a message through MyChart. You may also schedule an earlier appointment if necessary.  Additionally, you may be receiving a survey about your experience at our office within a few days to 1 week by e-mail or mail. We value your feedback.  Saralyn Pilar, DO [facility], [place]

## 2022-06-20 NOTE — Progress Notes (Signed)
Subjective:    Patient ID: Mike Dean, male    DOB: Oct 23, 1970, 52 y.o.   MRN: [id number]  Mike Dean is a 52 y.o. male presenting on 06/20/2022 for Follow-up (Pt was seen in the Urgent Care for Shingles on 06/17/2022) and Headache (Headache started today. /)  Last visit 06/2021 with me virtually  HPI  UC FOLLOW-UP VISIT  Hospital/Location: [facility] Date of UC Visit: 06/17/22  Reason for Presenting to UC: Shingles  FOLLOW-UP  - ED provider note and record have been reviewed - Patient presents today after 1 week of onset symptoms 1 week ago, initial onset with pain in back and question if early rash He has been seen at [facility] 6/2 diagnosed with Shingles. He was given rx Valtrex course at that time.  - Today reports overall has he done well after discharge from ED. Symptoms of pain stabbing burning in the area on back have improved some but still persistent. He has some itching in that area. He takes Ibuprofen / Tylenol alternating regular.  - New medications on discharge: Valtrex - Changes to current meds on discharge: none  I have reviewed the discharge medication list, and have reconciled the current and discharge medications today.      06/20/2022    2:05 PM 06/23/2021    3:31 PM 04/14/2019    1:33 PM  Depression screen PHQ 2/9  Decreased Interest 0 0 0  Down, Depressed, Hopeless 0 0 0  PHQ - 2 Score 0 0 0  Altered sleeping 0 0   Tired, decreased energy 0 0   Change in appetite 0 0   Feeling bad or failure about yourself  0 0   Trouble concentrating 0 0   Moving slowly or fidgety/restless 0 0   Suicidal thoughts 0 0   PHQ-9 Score 0 0   Difficult doing work/chores Not difficult at all Not difficult at all     Social History   Tobacco Use   Smoking status: Never   Smokeless tobacco: Never  Vaping Use   Vaping Use: Never used  Substance Use Topics   Alcohol use: Yes    Comment: 1x weekly   Drug use: No    Review of Systems Per  HPI unless specifically indicated above     Objective:    BP 112/74 (BP Location: Left Arm, Patient Position: Sitting, Cuff Size: Large)   Pulse 71   Temp (!) 96.6 F (35.9 C) (Temporal)   Wt 256 lb (116.1 kg)   SpO2 96%   BMI 32.87 kg/m   Wt Readings from Last 3 Encounters:  06/20/22 256 lb (116.1 kg)  06/23/21 235 lb (106.6 kg)  12/26/20 235 lb (106.6 kg)    Physical Exam Vitals and nursing note reviewed.  Constitutional:      General: He is not in acute distress.    Appearance: Normal appearance. He is well-developed. He is not diaphoretic.     Comments: Well-appearing, comfortable, cooperative  HENT:     Head: Normocephalic and atraumatic.  Eyes:     General:        Right eye: No discharge.        Left eye: No discharge.     Conjunctiva/sclera: Conjunctivae normal.  Cardiovascular:     Rate and Rhythm: Normal rate.  Pulmonary:     Effort: Pulmonary effort is normal.  Skin:    General: Skin is warm and dry.     Findings: Rash (patchy vesicular  raised rash Left low back and flank) present. No erythema.  Neurological:     Mental Status: He is alert and oriented to person, place, and time.  Psychiatric:        Mood and Affect: Mood normal.        Behavior: Behavior normal.        Thought Content: Thought content normal.     Comments: Well groomed, good eye contact, normal speech and thoughts     Left Low Back and Flank         Results for orders placed or performed in visit on 12/26/20  Microscopic Examination   Urine  Result Value Ref Range   WBC, UA 0-5 0 - 5 /hpf   RBC, Urine 0-2 0 - 2 /hpf   Epithelial Cells (non renal) None seen 0 - 10 /hpf   Bacteria, UA None seen None seen/Few  Urinalysis, Complete  Result Value Ref Range   Specific Gravity, UA 1.020 1.005 - 1.030   pH, UA 5.5 5.0 - 7.5   Color, UA Yellow Yellow   Appearance Ur Clear Clear   Leukocytes,UA Negative Negative   Protein,UA Negative Negative/Trace   Glucose, UA Negative  Negative   Ketones, UA Negative Negative   RBC, UA Trace (A) Negative   Bilirubin, UA Negative Negative   Urobilinogen, Ur 0.2 0.2 - 1.0 mg/dL   Nitrite, UA Negative Negative   Microscopic Examination See below:       Assessment & Plan:   Problem List Items Addressed This Visit   None Visit Diagnoses     Herpes zoster without complication    -  Primary   Relevant Medications   gabapentin (NEURONTIN) 100 MG capsule   triamcinolone cream (KENALOG) 0.5 %   Screening for colon cancer       Colon cancer screening       Relevant Orders   Cologuard       Clinically consistent with new acute shingles herpes zoster outbreak onset 5-6 days ago, with mild to moderate neuropathic pain. L back and flank  - Never received zoster vaccine - No evidence of complications, such as ocular involvement  Plan: Finish current course Valtrex 1000mg  THREE TIMES A DAY x 7 days Discussed potential course of post-herpetic neuralgia symptoms - offered medication options will send rx Gabapentin trial dose titrate, and can use topical Triamcinolone AS NEEDED Counseling on infectious nature of problem with avoiding close contact with high risk populations 4. Follow-up as needed within 4-6 weeks if not resolving or if residual pain, strict return criteria if facial or ocular involvement or other acute concerns  Future can get Shingrix vaccine >6 weeks after shingles has resolved.  Additionally he is due for Colon CA Screening Ordered Cologuard  Due for routine colon cancer screening. Never had colonoscopy (not interested), no family history colon cancer. - Discussion today about recommendations for either Colonoscopy or Cologuard screening, benefits and risks of screening, interested in Cologuard, understands that if positive then recommendation is for diagnostic colonoscopy to follow-up. - Ordered Cologuard today   Orders Placed This Encounter  Procedures   Cologuard      Meds ordered this  encounter  Medications   gabapentin (NEURONTIN) 100 MG capsule    Sig: Start 1 capsule daily at bedtime, increase by 1 cap every 2-3 days as tolerated up to 3 times a day, or may take 3 at once in evening.    Dispense:  90 capsule    Refill:  0   triamcinolone cream (KENALOG) 0.5 %    Sig: Apply 1 Application topically 2 (two) times daily. To affected areas, for up to 2 weeks.    Dispense:  30 g    Refill:  0      Follow up plan: Return if symptoms worsen or fail to improve.   Saralyn Pilar, DO [facility] 06/20/2022, 2:25 PM

## 2022-07-23 LAB — COLOGUARD

## 2022-07-25 ENCOUNTER — Encounter (INDEPENDENT_AMBULATORY_CARE_PROVIDER_SITE_OTHER): Payer: PRIVATE HEALTH INSURANCE | Admitting: Family Medicine

## 2022-07-25 DIAGNOSIS — H00025 Hordeolum internum left lower eyelid: Secondary | ICD-10-CM

## 2022-07-26 ENCOUNTER — Other Ambulatory Visit: Payer: Self-pay | Admitting: Family Medicine

## 2022-07-26 DIAGNOSIS — K219 Gastro-esophageal reflux disease without esophagitis: Secondary | ICD-10-CM

## 2022-07-26 MED ORDER — POLYMYXIN B-TRIMETHOPRIM 10000-0.1 UNIT/ML-% OP SOLN: 1.0000 [drp] | Freq: Four times a day (QID) | OPHTHALMIC | 0 refills | Status: DC

## 2022-07-26 NOTE — Telephone Encounter (Signed)

## 2022-07-26 NOTE — Telephone Encounter (Signed)
Requested Prescriptions  Pending Prescriptions Disp Refills   omeprazole (PRILOSEC) 20 MG capsule [Pharmacy Med Name: OMEPRAZOLE DR 20 MG CAPSULE] 90 capsule 0    Sig: TAKE 1 CAPSULE BY MOUTH DAILY BEFORE BREAKFAST.     Gastroenterology: Proton Pump Inhibitors Passed - 07/26/2022  2:27 AM      Passed - Valid encounter within last 12 months    Recent Outpatient Visits           1 month ago Herpes zoster without complication   [place] [facility] [place], Netta Neat, DO   1 year ago Gastroesophageal reflux disease, unspecified whether esophagitis present   [facility] Smitty Cords, DO   1 year ago Intermittent gross hematuria   [place] [facility] Smitty Cords, DO   3 years ago Environmental and seasonal allergies    [facility] Smitty Cords, DO   5 years ago Annual physical exam   [facility] [place], Netta Neat, [place]

## 2022-08-07 LAB — COLOGUARD: COLOGUARD: NEGATIVE

## 2022-10-20 ENCOUNTER — Other Ambulatory Visit: Payer: Self-pay | Admitting: Family Medicine

## 2022-10-20 DIAGNOSIS — K219 Gastro-esophageal reflux disease without esophagitis: Secondary | ICD-10-CM

## 2022-10-22 NOTE — Telephone Encounter (Signed)
Patient will need to schedule an office visit prior to next refill. Requested Prescriptions  Pending Prescriptions Disp Refills   omeprazole (PRILOSEC) 20 MG capsule [Pharmacy Med Name: OMEPRAZOLE DR 20 MG CAPSULE] 90 capsule 0    Sig: TAKE 1 CAPSULE BY MOUTH DAILY BEFORE BREAKFAST.     Gastroenterology: Proton Pump Inhibitors Passed - 10/20/2022  1:14 AM      Passed - Valid encounter within last 12 months    Recent Outpatient Visits           4 months ago Herpes zoster without complication   [place] [facility] [place], Netta Neat, DO   1 year ago Gastroesophageal reflux disease, unspecified whether esophagitis present   [facility] Smitty Cords, DO   1 year ago Intermittent gross hematuria   [facility] Smitty Cords, DO   3 years ago Environmental and seasonal allergies   [place] [facility] Smitty Cords, DO   5 years ago Annual physical exam   [facility] [place], Netta Neat, [place]

## 2022-12-04 ENCOUNTER — Other Ambulatory Visit: Payer: Self-pay | Admitting: Family Medicine

## 2022-12-04 DIAGNOSIS — K219 Gastro-esophageal reflux disease without esophagitis: Secondary | ICD-10-CM

## 2022-12-12 ENCOUNTER — Other Ambulatory Visit: Payer: Self-pay | Admitting: Family Medicine

## 2022-12-12 DIAGNOSIS — K219 Gastro-esophageal reflux disease without esophagitis: Secondary | ICD-10-CM

## 2022-12-18 ENCOUNTER — Other Ambulatory Visit: Payer: Self-pay | Admitting: Family Medicine

## 2022-12-18 DIAGNOSIS — K219 Gastro-esophageal reflux disease without esophagitis: Secondary | ICD-10-CM

## 2023-03-24 ENCOUNTER — Other Ambulatory Visit: Payer: Self-pay | Admitting: Family Medicine

## 2023-03-24 DIAGNOSIS — K219 Gastro-esophageal reflux disease without esophagitis: Secondary | ICD-10-CM

## 2023-03-25 NOTE — Telephone Encounter (Signed)
 Pt needs an appt

## 2023-03-26 NOTE — Telephone Encounter (Signed)
 Called pt and left message to return call and schedule appt.

## 2023-03-26 NOTE — Telephone Encounter (Signed)
 Courtesy refill. Patient will need an office visit for additional refills. Requested Prescriptions  Pending Prescriptions Disp Refills   omeprazole (PRILOSEC) 20 MG capsule [Pharmacy Med Name: OMEPRAZOLE DR 20 MG CAPSULE] 30 capsule 0    Sig: TAKE 1 CAPSULE BY MOUTH EVERY DAY BEFORE BREAKFAST     Gastroenterology: Proton Pump Inhibitors Passed - 03/26/2023  8:15 AM      Passed - Valid encounter within last 12 months    Recent Outpatient Visits           9 months ago Herpes zoster without complication   [facility] [place], Netta Neat, DO   1 year ago Gastroesophageal reflux disease, unspecified whether esophagitis present   [facility] [facility] Smitty Cords, DO   2 years ago Intermittent gross hematuria   [place] [facility] Smitty Cords, DO   3 years ago Environmental and seasonal allergies   [place] [facility] Smitty Cords, DO   6 years ago Annual physical exam   [facility] [place], Netta Neat, [place]

## 2023-03-29 ENCOUNTER — Encounter: Payer: Self-pay | Admitting: Family Medicine

## 2023-03-29 ENCOUNTER — Ambulatory Visit (INDEPENDENT_AMBULATORY_CARE_PROVIDER_SITE_OTHER): Admitting: Family Medicine

## 2023-03-29 VITALS — BP 110/72 | HR 72 | Ht 74.0 in | Wt 252.0 lb

## 2023-03-29 DIAGNOSIS — Z125 Encounter for screening for malignant neoplasm of prostate: Secondary | ICD-10-CM

## 2023-03-29 DIAGNOSIS — E559 Vitamin D deficiency, unspecified: Secondary | ICD-10-CM

## 2023-03-29 DIAGNOSIS — K219 Gastro-esophageal reflux disease without esophagitis: Secondary | ICD-10-CM | POA: Diagnosis not present

## 2023-03-29 DIAGNOSIS — Z0000 Encounter for general adult medical examination without abnormal findings: Secondary | ICD-10-CM

## 2023-03-29 DIAGNOSIS — E66811 Obesity, class 1: Secondary | ICD-10-CM

## 2023-03-29 DIAGNOSIS — Z13220 Encounter for screening for lipoid disorders: Secondary | ICD-10-CM

## 2023-03-29 DIAGNOSIS — N138 Other obstructive and reflux uropathy: Secondary | ICD-10-CM

## 2023-03-29 DIAGNOSIS — N401 Enlarged prostate with lower urinary tract symptoms: Secondary | ICD-10-CM

## 2023-03-29 MED ORDER — OMEPRAZOLE 20 MG PO CPDR: 20.0000 mg | DELAYED_RELEASE_CAPSULE | Freq: Every day | ORAL | 3 refills | Status: AC

## 2023-03-29 NOTE — Patient Instructions (Addendum)
 Thank you for coming to the office today.  Shingrix vaccine at the pharmacy 2 doses 2-6 months apart  You have been referred for a Coronary Calcium Score Cardiac CT Scan. This is a screening test for patients aged 54-50+ with cardiovascular risk factors or who are healthy but would be interested in Cardiovascular Screening for heart disease. Even if there is a family history of heart disease, this imaging can be useful. Typically it can be done every 5+ years or at a different timeline we agree on  The scan will look at the chest and mainly focus on the heart and identify early signs of calcium build up or blockages within the heart arteries. It is not 100% accurate for identifying blockages or heart disease, but it is useful to help [place] predict who may have some early changes or be at risk in the future for a heart attack or cardiovascular problem.  The results are reviewed by a Cardiologist and they will document the results. It should become available on MyChart. Typically the results are divided into percentiles based on other patients of the same demographic and age. So it will compare your risk to others similar to you. If you have a higher score >99 or higher percentile >75%tile, it is recommended to consider Statin cholesterol therapy and or referral to Cardiologist. I will try to help explain your results and if we have questions we can contact the Cardiologist.  You will be contacted for scheduling. Usually it is done at any imaging facility through [facility], [facility] or [facility].  The cost is $99 flat fee total and it does not go through insurance, so no authorization is required.   DUE for FASTING BLOOD WORK (no food or drink after midnight before the lab appointment, only water or coffee without cream/sugar on the morning of)  Lab next Monday 9am   - Make sure Lab Only appointment is at about 1 week before your next appointment, so that results  will be available  For Lab Results, once available within 2-3 days of blood draw, you can can log in to MyChart online to view your results and a brief explanation. Also, we can discuss results at next follow-up visit.   Please schedule a Follow-up Appointment to: Return in about 1 year (around 03/28/2024) for 1 year fasting lab > 1 week later Annual Physical.  If you have any other questions or concerns, please feel free to call the office or send a message through MyChart. You may also schedule an earlier appointment if necessary.  Additionally, you may be receiving a survey about your experience at our office within a few days to 1 week by e-mail or mail. We value your feedback.  Saralyn Pilar, DO [facility], [place]

## 2023-03-29 NOTE — Progress Notes (Signed)
 Subjective:    Patient ID: Mike Dean, male    DOB: 04/14/1970, 53 y.o.   MRN: [id number]  Mike Dean is a 53 y.o. male presenting on 03/29/2023 for Annual Exam   HPI  Discussed the use of AI scribe software for clinical note transcription with the patient, who gave verbal consent to proceed.  History of Present Illness   Mike Dean "[name]" is a 53 year old male who presents for a routine follow-up and medication refill.  His last visit was related to an urgent care visit for shingles last summer, which resolved without complications. He inquires about the shingles vaccine, wanting to avoid recurrence due to the discomfort experienced during the initial episode.  He is currently taking omeprazole and requires a refill. He has been trying to reduce the frequency of use but experiences discomfort when not taking it daily. He also takes Allegra daily for allergies and has previously used montelukast but is no longer taking it.  He experiences tingling in his arms upon waking, which he attributes to positional nerve pinching rather than circulation issues. He describes himself as a side sleeper and notes that the tingling occurs in both arms.  He is interested in having blood work done, including tests for vitamin D and thyroid function. He is not fasting today and plans to return for fasting labs. He is curious about testosterone levels but decides against testing due to lack of symptoms.       Health Maintenance: See below A&P     03/29/2023    9:18 AM 06/20/2022    2:05 PM 06/23/2021    3:31 PM  Depression screen PHQ 2/9  Decreased Interest 0 0 0  Down, Depressed, Hopeless 0 0 0  PHQ - 2 Score 0 0 0  Altered sleeping 0 0 0  Tired, decreased energy 0 0 0  Change in appetite 0 0 0  Feeling bad or failure about yourself  0 0 0  Trouble concentrating 0 0 0  Moving slowly or fidgety/restless 0 0 0  Suicidal thoughts 0 0 0  PHQ-9 Score 0 0 0  Difficult doing work/chores  Not  difficult at all Not difficult at all       03/29/2023    9:18 AM 06/20/2022    2:05 PM 06/23/2021    3:31 PM  GAD 7 : Generalized Anxiety Score  Nervous, Anxious, on Edge 0 1 0  Control/stop worrying 0 1 0  Worry too much - different things 0 0 0  Trouble relaxing 0 0 0  Restless 0 0 0  Easily annoyed or irritable 0 0 0  Afraid - awful might happen 0 0 0  Total GAD 7 Score 0 2 0  Anxiety Difficulty  Not difficult at all Not difficult at all     Past Medical History:  Diagnosis Date   GERD (gastroesophageal reflux disease)    Past Surgical History:  Procedure Laterality Date   ANKLE SURGERY Left    Social History   Socioeconomic History   Marital status: Married    Spouse name: Not on file   Number of children: Not on file   Years of education: Not on file   Highest education level: Not on file  Occupational History   Occupation: Pensions consultant)  Tobacco Use   Smoking status: Never   Smokeless tobacco: Never  Vaping Use   Vaping status: Never Used  Substance and Sexual Activity   Alcohol use: Yes  Comment: 1x weekly   Drug use: No   Sexual activity: Not on file  Other Topics Concern   Not on file  Social History Narrative   Not on file   Social Drivers of Health   Financial Resource Strain: Not on file  Food Insecurity: Not on file  Transportation Needs: Not on file  Physical Activity: Not on file  Stress: Not on file  Social Connections: Not on file  Intimate Partner Violence: Not on file   Family History  Problem Relation Age of Onset   Stroke Mother    Bipolar disorder Sister    Breast cancer Maternal Grandmother    Throat cancer Maternal Grandfather        Smoker   Colon cancer Other 75   Prostate cancer Neg Hx    Current Outpatient Medications on File Prior to Visit  Medication Sig   fexofenadine (ALLEGRA) 180 MG tablet TAKE 1 TABLET BY MOUTH EVERY DAY   fluticasone (FLONASE) 50 MCG/ACT nasal spray Place 2 sprays into  both nostrils daily. Use for 4-6 weeks then stop and use seasonally or as needed.   saw palmetto 160 MG capsule Take 1 capsule (160 mg total) by mouth 2 (two) times daily.   triamcinolone cream (KENALOG) 0.5 % Apply 1 Application topically 2 (two) times daily. To affected areas, for up to 2 weeks. (Patient not taking: Reported on 03/29/2023)   No current facility-administered medications on file prior to visit.    Review of Systems  Constitutional:  Negative for activity change, appetite change, chills, diaphoresis, fatigue and fever.  HENT:  Negative for congestion and hearing loss.   Eyes:  Negative for visual disturbance.  Respiratory:  Negative for cough, chest tightness, shortness of breath and wheezing.   Cardiovascular:  Negative for chest pain, palpitations and leg swelling.  Gastrointestinal:  Negative for abdominal pain, constipation, diarrhea, nausea and vomiting.  Genitourinary:  Negative for dysuria, frequency and hematuria.  Musculoskeletal:  Negative for arthralgias and neck pain.  Skin:  Negative for rash.  Neurological:  Negative for dizziness, weakness, light-headedness, numbness and headaches.  Hematological:  Negative for adenopathy.  Psychiatric/Behavioral:  Negative for behavioral problems, dysphoric mood and sleep disturbance.    Per HPI unless specifically indicated above     Objective:    BP 110/72   Pulse 72   Ht 6\' 2"  (1.88 m)   Wt 252 lb (114.3 kg)   SpO2 98%   BMI 32.35 kg/m   Wt Readings from Last 3 Encounters:  03/29/23 252 lb (114.3 kg)  06/20/22 256 lb (116.1 kg)  06/23/21 235 lb (106.6 kg)    Physical Exam Vitals and nursing note reviewed.  Constitutional:      General: He is not in acute distress.    Appearance: He is well-developed. He is not diaphoretic.     Comments: Well-appearing, comfortable, cooperative  HENT:     Head: Normocephalic and atraumatic.     Right Ear: Tympanic membrane, ear canal and external ear normal. There is no  impacted cerumen.     Left Ear: Tympanic membrane, ear canal and external ear normal. There is no impacted cerumen.  Eyes:     General:        Right eye: No discharge.        Left eye: No discharge.     Conjunctiva/sclera: Conjunctivae normal.     Pupils: Pupils are equal, round, and reactive to light.  Neck:     Thyroid: No  thyromegaly.     Vascular: No carotid bruit.  Cardiovascular:     Rate and Rhythm: Normal rate and regular rhythm.     Pulses: Normal pulses.     Heart sounds: Normal heart sounds. No murmur heard. Pulmonary:     Effort: Pulmonary effort is normal. No respiratory distress.     Breath sounds: Normal breath sounds. No wheezing or rales.  Abdominal:     General: Bowel sounds are normal. There is no distension.     Palpations: Abdomen is soft. There is no mass.     Tenderness: There is no abdominal tenderness.  Musculoskeletal:        General: No tenderness. Normal range of motion.     Cervical back: Normal range of motion and neck supple.     Right lower leg: No edema.     Left lower leg: No edema.     Comments: Upper / Lower Extremities: - Normal muscle tone, strength bilateral upper extremities 5/5, lower extremities 5/5  Lymphadenopathy:     Cervical: No cervical adenopathy.  Skin:    General: Skin is warm and dry.     Findings: No erythema or rash.  Neurological:     Mental Status: He is alert and oriented to person, place, and time.     Comments: Distal sensation intact to light touch all extremities  Psychiatric:        Mood and Affect: Mood normal.        Behavior: Behavior normal.        Thought Content: Thought content normal.     Comments: Well groomed, good eye contact, normal speech and thoughts     Results for orders placed or performed in visit on 06/20/22  Cologuard   Collection Time: 07/21/22  9:19 AM  Result Value Ref Range   COLOGUARD Sample Could Not Be Processed 3 N/A  Cologuard   Collection Time: 08/01/22  7:30 AM  Result  Value Ref Range   COLOGUARD Negative Negative      Assessment & Plan:   Problem List Items Addressed This Visit     BPH with obstruction/lower urinary tract symptoms   Relevant Orders   PSA   Other Visit Diagnoses       Annual physical exam    -  Primary   Relevant Orders   Lipid panel   Hemoglobin A1c   COMPLETE METABOLIC PANEL WITH GFR   CBC with Differential/Platelet   TSH     Vitamin D deficiency       Relevant Orders   VITAMIN D 25 Hydroxy (Vit-D Deficiency, Fractures)     Gastroesophageal reflux disease without esophagitis       Relevant Medications   omeprazole (PRILOSEC) 20 MG capsule   Other Relevant Orders   COMPLETE METABOLIC PANEL WITH GFR     Screening cholesterol level       Relevant Orders   Lipid panel   COMPLETE METABOLIC PANEL WITH GFR     Screening for prostate cancer       Relevant Orders   PSA     Obesity (BMI 30.0-34.9)       Relevant Orders   Lipid panel   COMPLETE METABOLIC PANEL WITH GFR   TSH   CT CARDIAC SCORING (SELF PAY ONLY)     Gastroesophageal reflux disease, unspecified whether esophagitis present       Relevant Medications   omeprazole (PRILOSEC) 20 MG capsule        Updated  Health Maintenance information Fasting labs ordered Encouraged improvement to lifestyle with diet and exercise Goal of weight loss  History of Shingles Emphasized Shingrix vaccination for enhanced immunity as prior infection does not confer strong immunity. Explained potential flu-like symptoms post-vaccination. - Recommend Shingrix vaccine, two doses, two to six months apart, preferably at a pharmacy for cost savings.  Gastroesophageal Reflux Disease (GERD) Chronic GERD managed with low-dose omeprazole. Discussed minimal long-term risks of continued use at current dosage. - Refill omeprazole 20mg  prescription for one year.  BPH LUTS On Saw Palmetto (restart therapy)  Reviewed paresthesia symptoms, recommendations on wrist splint / elbow wrap to  avoid pinching nerves and follow up progress  General Health Maintenance Discussed health maintenance screenings and vaccinations. Recommended Cologuard for colon cancer screening and coronary artery calcium score CT scan for heart health assessment. - Order fasting blood tests including glucose, cholesterol, chemistry, kidney, liver, blood count, prostate, thyroid, and vitamin D. - Schedule coronary artery calcium score CT scan through [facility] for heart health assessment. - Continue Cologuard every three years for colon cancer screening. Next due 2027 - Schedule annual physical and blood tests for next year, with blood tests to be done in advance of the appointment.         Orders Placed This Encounter  Procedures   CT CARDIAC SCORING (SELF PAY ONLY)    Standing Status:   Future    Expiration Date:   03/28/2024    Preferred imaging location?:   [facility]   Lipid panel    Standing Status:   Future    Number of Occurrences:   1    Expected Date:   04/01/2023    Expiration Date:   03/28/2024    Has the patient fasted?:   Yes   Hemoglobin A1c    Standing Status:   Future    Number of Occurrences:   1    Expected Date:   04/01/2023    Expiration Date:   03/28/2024   COMPLETE METABOLIC PANEL WITH GFR    Standing Status:   Future    Number of Occurrences:   1    Expected Date:   04/01/2023    Expiration Date:   03/28/2024   CBC with Differential/Platelet    Standing Status:   Future    Number of Occurrences:   1    Expected Date:   04/01/2023    Expiration Date:   03/28/2024   PSA    Standing Status:   Future    Number of Occurrences:   1    Expected Date:   04/01/2023    Expiration Date:   03/28/2024   TSH    Standing Status:   Future    Number of Occurrences:   1    Expected Date:   04/01/2023    Expiration Date:   03/28/2024   VITAMIN D 25 Hydroxy (Vit-D Deficiency, Fractures)    Standing Status:   Future    Number of Occurrences:   1    Expected Date:   04/01/2023     Expiration Date:   03/28/2024    Meds ordered this encounter  Medications   omeprazole (PRILOSEC) 20 MG capsule    Sig: Take 1 capsule (20 mg total) by mouth daily before breakfast.    Dispense:  90 capsule    Refill:  3     Follow up plan: Return in about 1 year (around 03/28/2024) for 1 year fasting lab > 1  week later Annual Physical.  Future labs need CMET CBC PSA A1c Lipid possible Vit D  Saralyn Pilar, DO [facility] 03/29/2023, 9:46 AM

## 2023-04-01 ENCOUNTER — Other Ambulatory Visit

## 2023-04-01 DIAGNOSIS — E559 Vitamin D deficiency, unspecified: Secondary | ICD-10-CM

## 2023-04-01 DIAGNOSIS — N401 Enlarged prostate with lower urinary tract symptoms: Secondary | ICD-10-CM

## 2023-04-01 DIAGNOSIS — E66811 Obesity, class 1: Secondary | ICD-10-CM

## 2023-04-01 DIAGNOSIS — Z125 Encounter for screening for malignant neoplasm of prostate: Secondary | ICD-10-CM

## 2023-04-01 DIAGNOSIS — Z0000 Encounter for general adult medical examination without abnormal findings: Secondary | ICD-10-CM

## 2023-04-01 DIAGNOSIS — K219 Gastro-esophageal reflux disease without esophagitis: Secondary | ICD-10-CM

## 2023-04-01 DIAGNOSIS — Z13220 Encounter for screening for lipoid disorders: Secondary | ICD-10-CM

## 2023-04-02 ENCOUNTER — Ambulatory Visit
Admission: RE | Admit: 2023-04-02 | Discharge: 2023-04-02 | Disposition: A | Discharge status: Home / Self Care | Payer: Self-pay | Source: Ambulatory Visit | Attending: Family Medicine | Admitting: Family Medicine

## 2023-04-02 DIAGNOSIS — E66811 Obesity, class 1: Secondary | ICD-10-CM | POA: Insufficient documentation

## 2023-04-02 LAB — CBC WITH DIFFERENTIAL/PLATELET
Absolute Lymphocytes: 1742 {cells}/uL (ref 850–3900)
Absolute Monocytes: 482 {cells}/uL (ref 200–950)
Basophils Absolute: 22 {cells}/uL (ref 0–200)
Basophils Relative: 0.4 %
Eosinophils Absolute: 168 {cells}/uL (ref 15–500)
Eosinophils Relative: 3 %
HCT: 44.8 % (ref 38.5–50.0)
Hemoglobin: 15.1 g/dL (ref 13.2–17.1)
MCH: 31.7 pg (ref 27.0–33.0)
MCHC: 33.7 g/dL (ref 32.0–36.0)
MCV: 93.9 fL (ref 80.0–100.0)
MPV: 9.6 fL (ref 7.5–12.5)
Monocytes Relative: 8.6 %
Neutro Abs: 3186 {cells}/uL (ref 1500–7800)
Neutrophils Relative %: 56.9 %
Platelets: 251 10*3/uL (ref 140–400)
RBC: 4.77 10*6/uL (ref 4.20–5.80)
RDW: 12.1 % (ref 11.0–15.0)
Total Lymphocyte: 31.1 %
WBC: 5.6 10*3/uL (ref 3.8–10.8)

## 2023-04-02 LAB — VITAMIN D 25 HYDROXY (VIT D DEFICIENCY, FRACTURES): Vit D, 25-Hydroxy: 59 ng/mL (ref 30–100)

## 2023-04-02 LAB — COMPLETE METABOLIC PANEL WITH GFR
AG Ratio: 1.5 (calc) (ref 1.0–2.5)
ALT: 30 U/L (ref 9–46)
AST: 18 U/L (ref 10–35)
Albumin: 4.3 g/dL (ref 3.6–5.1)
Alkaline phosphatase (APISO): 63 U/L (ref 35–144)
BUN: 19 mg/dL (ref 7–25)
CO2: 26 mmol/L (ref 20–32)
Calcium: 9.5 mg/dL (ref 8.6–10.3)
Chloride: 103 mmol/L (ref 98–110)
Creat: 1.12 mg/dL (ref 0.70–1.30)
Globulin: 2.8 g/dL (ref 1.9–3.7)
Glucose, Bld: 107 mg/dL — ABNORMAL HIGH (ref 65–99)
Potassium: 4.4 mmol/L (ref 3.5–5.3)
Sodium: 139 mmol/L (ref 135–146)
Total Bilirubin: 0.5 mg/dL (ref 0.2–1.2)
Total Protein: 7.1 g/dL (ref 6.1–8.1)
eGFR: 78 mL/min/{1.73_m2} (ref >=60)

## 2023-04-02 LAB — PSA: PSA: 2.55 ng/mL (ref <=4.00)

## 2023-04-02 LAB — LIPID PANEL
Cholesterol: 211 mg/dL — ABNORMAL HIGH (ref <200)
HDL: 55 mg/dL (ref >=40)
LDL Cholesterol (Calc): 128 mg/dL — ABNORMAL HIGH
Non-HDL Cholesterol (Calc): 156 mg/dL — ABNORMAL HIGH (ref <130)
Total CHOL/HDL Ratio: 3.8 (calc) (ref <5.0)
Triglycerides: 163 mg/dL — ABNORMAL HIGH (ref <150)

## 2023-04-02 LAB — HEMOGLOBIN A1C
Hgb A1c MFr Bld: 5.5 %{Hb} (ref <5.7)
Mean Plasma Glucose: 111 mg/dL
eAG (mmol/L): 6.2 mmol/L

## 2023-04-03 ENCOUNTER — Encounter: Payer: Self-pay | Admitting: Family Medicine

## 2023-07-25 ENCOUNTER — Telehealth: Payer: Self-pay | Admitting: Physician Assistant

## 2023-07-25 ENCOUNTER — Other Ambulatory Visit: Payer: Self-pay | Admitting: Physician Assistant

## 2023-07-25 DIAGNOSIS — M62830 Muscle spasm of back: Secondary | ICD-10-CM

## 2023-07-25 MED ORDER — NAPROXEN 500 MG PO TABS: 500.0000 mg | ORAL_TABLET | Freq: Two times a day (BID) | ORAL | 0 refills | Status: AC

## 2023-07-25 MED ORDER — METHOCARBAMOL 750 MG PO TABS: ORAL_TABLET | ORAL | 0 refills | Status: AC

## 2023-07-25 NOTE — Telephone Encounter (Signed)
 As on-call provider, received call from nurse triage at 8:26 pm. Mike Dean is barely able to ambulate due to severe back spasm with debilitating pain. He says this same thing happens about once per year, and he feels that his back has locked up. He worked out at [employer] on Monday and thinks that set up for tension in the lower back resulting in spasm. The actual incident occurred this morning when he leaned over to tie his shoe. His fiance is with him in the home and participates in the call.   Nurse triage advised ED visit, but given that this is not a new problem per se and the fact that he is stable at home with help, I feel he can be managed reasonably well with NSAID and muscle relaxer. He was informed that on-call providers do not usually prescribe medications. He has no contraindications to rx NSAID, recent CMP reviewed with normal renal function. Rx sent for naproxen  500mg  and methocarbamol , patient specifically requested [facility]. due to being open late.  Routing to PCP.  Advised any further concerns to be addressed by PCP office.   Rolan Hoyle, PA-C, DMSc, Nutritionist [facility] [phone]

## 2024-04-03 ENCOUNTER — Other Ambulatory Visit

## 2024-04-24 ENCOUNTER — Encounter: Admitting: Family Medicine
